# Patient Record
Sex: Female | Born: 1995 | Hispanic: No | Marital: Married | Smoking: Never smoker
Health system: Southern US, Community
[De-identification: ages and names within clinical notes are randomized; demographics above are authoritative.]

## PROBLEM LIST (undated history)

## (undated) ENCOUNTER — Inpatient Hospital Stay (HOSPITAL_COMMUNITY): Payer: Self-pay

## (undated) DIAGNOSIS — R000 Tachycardia, unspecified: Secondary | ICD-10-CM

## (undated) DIAGNOSIS — E538 Deficiency of other specified B group vitamins: Secondary | ICD-10-CM

## (undated) DIAGNOSIS — O039 Complete or unspecified spontaneous abortion without complication: Secondary | ICD-10-CM

## (undated) DIAGNOSIS — Z789 Other specified health status: Secondary | ICD-10-CM

## (undated) HISTORY — PX: NO PAST SURGERIES: SHX2092

---
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
## 2015-01-03 LAB — OB RESULTS CONSOLE HIV ANTIBODY (ROUTINE TESTING): HIV: NONREACTIVE

## 2015-01-03 LAB — OB RESULTS CONSOLE RPR: RPR: NONREACTIVE

## 2015-01-05 ENCOUNTER — Other Ambulatory Visit (HOSPITAL_COMMUNITY): Payer: Self-pay | Admitting: Urology

## 2015-01-05 DIAGNOSIS — Z3689 Encounter for other specified antenatal screening: Secondary | ICD-10-CM

## 2015-01-16 MED ORDER — AMOXICILLIN 500 MG PO CAPS: 500.0000 mg | ORAL_CAPSULE | Freq: Three times a day (TID) | ORAL | Status: DC

## 2015-01-16 NOTE — Telephone Encounter (Signed)
See telephone note Erin Rosenthal, NP

## 2015-01-26 ENCOUNTER — Ambulatory Visit (HOSPITAL_COMMUNITY)
Admission: RE | Admit: 2015-01-26 | Discharge: 2015-01-26 | Disposition: A | Discharge status: Home / Self Care | Payer: Self-pay | Source: Ambulatory Visit | Attending: Physician Assistant | Admitting: Physician Assistant

## 2015-01-26 DIAGNOSIS — Z36 Encounter for antenatal screening of mother: Secondary | ICD-10-CM | POA: Insufficient documentation

## 2015-01-26 DIAGNOSIS — Z3689 Encounter for other specified antenatal screening: Secondary | ICD-10-CM

## 2015-01-31 ENCOUNTER — Inpatient Hospital Stay (HOSPITAL_COMMUNITY)
Admission: AD | Admit: 2015-01-31 | Discharge: 2015-01-31 | Disposition: A | Discharge status: Home / Self Care | Payer: Self-pay | Source: Ambulatory Visit | Attending: Obstetrics & Gynecology | Admitting: Obstetrics & Gynecology

## 2015-01-31 ENCOUNTER — Encounter (HOSPITAL_COMMUNITY): Payer: Self-pay

## 2015-01-31 DIAGNOSIS — Z3A20 20 weeks gestation of pregnancy: Secondary | ICD-10-CM | POA: Insufficient documentation

## 2015-01-31 DIAGNOSIS — O26892 Other specified pregnancy related conditions, second trimester: Secondary | ICD-10-CM | POA: Insufficient documentation

## 2015-01-31 DIAGNOSIS — E538 Deficiency of other specified B group vitamins: Secondary | ICD-10-CM | POA: Insufficient documentation

## 2015-01-31 DIAGNOSIS — S3991XA Unspecified injury of abdomen, initial encounter: Secondary | ICD-10-CM

## 2015-01-31 DIAGNOSIS — W010XXA Fall on same level from slipping, tripping and stumbling without subsequent striking against object, initial encounter: Secondary | ICD-10-CM | POA: Insufficient documentation

## 2015-01-31 DIAGNOSIS — Z8249 Family history of ischemic heart disease and other diseases of the circulatory system: Secondary | ICD-10-CM | POA: Insufficient documentation

## 2015-01-31 DIAGNOSIS — R10819 Abdominal tenderness, unspecified site: Secondary | ICD-10-CM | POA: Insufficient documentation

## 2015-01-31 LAB — URINE MICROSCOPIC-ADD ON

## 2015-01-31 LAB — URINALYSIS, ROUTINE W REFLEX MICROSCOPIC
Bilirubin Urine: NEGATIVE
GLUCOSE, UA: NEGATIVE mg/dL
Hgb urine dipstick: NEGATIVE
Ketones, ur: NEGATIVE mg/dL
NITRITE: NEGATIVE
PROTEIN: NEGATIVE mg/dL
Specific Gravity, Urine: 1.01 (ref 1.005–1.030)
UROBILINOGEN UA: 0.2 mg/dL (ref 0.0–1.0)
pH: 6.5 (ref 5.0–8.0)

## 2015-01-31 NOTE — MAU Note (Signed)
Pt presents to MAU with complaints of falling on her abdomen while she was being chased by a dog. Denies any vaginal bleeding.

## 2015-01-31 NOTE — MAU Provider Note (Signed)
MAU HISTORY AND PHYSICAL  Chief Complaint:  Erin Newman is a 19 y.o.  G3P1011 with IUP at [redacted]w[redacted]d presenting for Fall Patient was walking carrying her young child when she saw a dog that scared her. She turned to run away, tripped, and fell, landing on her left hip and left abdomen. She currently has mild pain. NO vaginal bleeding or discharge. Has not yet felt baby move this pregnancy. No other injuries.   Menstrual History: OB History    Gravida Para Term Preterm AB TAB SAB Ectopic Multiple Living   0 1 0 1 0 0 1      Patient's last menstrual period was 09/13/2014 (exact date).      Past Medical History  Diagnosis Date  . B12 deficiency   . Rapid heart beat   . Miscarriage   . Medical history non-contributory     Past Surgical History  Procedure Laterality Date  . No past surgeries      Family History  Problem Relation Age of Onset  . Hypertension Mother   . Hypertension Father     Social History  Substance Use Topics  . Smoking status: Never Smoker   . Smokeless tobacco: Never Used  . Alcohol Use: No     No Known Allergies  Prescriptions prior to admission  Medication Sig Dispense Refill Last Dose  . nystatin cream (MYCOSTATIN) Apply 1 application topically 2 (two) times daily. 30 g 0 01/30/2015 at Unknown time  . Prenatal Vit-Fe Fumarate-FA (PRENATAL MULTIVITAMIN) TABS tablet Take 1 tablet by mouth daily at 12 noon.   Past Week at Unknown time  . amoxicillin (AMOXIL) 500 MG capsule Take 1 capsule (500 mg total) by mouth 3 (three) times daily. (Patient not taking: Reported on 01/31/2015) 30 capsule 0 Not Taking at Unknown time  . terconazole (TERAZOL 3) 0.8 % vaginal cream Place 1 applicator vaginally at bedtime. (Patient not taking: Reported on 01/31/2015) 20 g 0 Completed Course at Unknown time  . triamcinolone cream (KENALOG) 0.1 % Apply 1 application topically 2 (two) times daily. (Patient not taking: Reported on 01/31/2015) 30 g 0 Completed Course  at Unknown time    Review of Systems - Negative except for what is mentioned in HPI.  Physical Exam  Blood pressure 119/63, pulse 112, temperature 98.6 F (37 C), resp. rate 18, last menstrual period 09/13/2014, not currently breastfeeding. GENERAL: Well-developed, well-nourished female in no acute distress.  LUNGS: Clear to auscultation bilaterally.  HEART: Regular rate and rhythm. ABDOMEN: Soft, gravid to umbilicus, mild ttp left lower quadrant no rebound, no distention EXTREMITIES: Nontender, no edema, 2+ distal pulses.  FHTs: wnl  Labs: Results for orders placed or performed during the hospital encounter of 01/31/15 (from the past 24 hour(s))  Urinalysis, Routine w reflex microscopic (not at [facility])   Collection Time: 01/31/15 11:50 AM  Result Value Ref Range   Color, Urine YELLOW YELLOW   APPearance CLOUDY (A) CLEAR   Specific Gravity, Urine 1.010 1.005 - 1.030   pH 6.5 5.0 - 8.0   Glucose, UA NEGATIVE NEGATIVE mg/dL   Hgb urine dipstick NEGATIVE NEGATIVE   Bilirubin Urine NEGATIVE NEGATIVE   Ketones, ur NEGATIVE NEGATIVE mg/dL   Protein, ur NEGATIVE NEGATIVE mg/dL   Urobilinogen, UA 0.2 0.0 - 1.0 mg/dL   Nitrite NEGATIVE NEGATIVE   Leukocytes, UA SMALL (A) NEGATIVE  Urine microscopic-add on   Collection Time: 01/31/15 11:50 AM  Result Value Ref Range   Squamous  Epithelial / LPF MANY (A) RARE   WBC, UA 7-10 <3 WBC/hpf   RBC / HPF 0-2 <3 RBC/hpf   Bacteria, UA MANY (A) RARE    Imaging Studies:  [place] Mfm Of Comp + 14 Wk  01/26/2015   OBSTETRICAL ULTRASOUND: This exam was performed within a [facility]. The OB US report was generated in the AS system, and faxed to the ordering physician.   This report is available in the [employer]. See the [facility] Obstetric US report via the Image Link.   Assessment: Erin Newman is  20 y.o. G3P1011 at [redacted]w[redacted]d presents with fall from standing. Mild LLQ abdominal tenderness, no visible injury, otherwise normal exam.  Normal reassuring FHTs. Patient is O positive so no indication for rhogam. Monitored here for approximately 4 hours.  Plan: - d/c home with PTL, PPROM, and abruption precautions - health department f/u 3 days as planned  Silvano Bilis 8/20/20161:54 PM

## 2015-03-10 ENCOUNTER — Encounter (HOSPITAL_COMMUNITY): Payer: Self-pay | Admitting: *Deleted

## 2015-03-10 ENCOUNTER — Inpatient Hospital Stay (HOSPITAL_COMMUNITY)
Admission: AD | Admit: 2015-03-10 | Discharge: 2015-03-10 | Disposition: A | Discharge status: Home / Self Care | Payer: Self-pay | Source: Ambulatory Visit | Attending: Obstetrics and Gynecology | Admitting: Obstetrics and Gynecology

## 2015-03-10 DIAGNOSIS — Z3A25 25 weeks gestation of pregnancy: Secondary | ICD-10-CM | POA: Insufficient documentation

## 2015-03-10 DIAGNOSIS — R110 Nausea: Secondary | ICD-10-CM

## 2015-03-10 DIAGNOSIS — O99512 Diseases of the respiratory system complicating pregnancy, second trimester: Secondary | ICD-10-CM | POA: Insufficient documentation

## 2015-03-10 DIAGNOSIS — K30 Functional dyspepsia: Secondary | ICD-10-CM

## 2015-03-10 DIAGNOSIS — R6889 Other general symptoms and signs: Secondary | ICD-10-CM

## 2015-03-10 DIAGNOSIS — O212 Late vomiting of pregnancy: Secondary | ICD-10-CM | POA: Insufficient documentation

## 2015-03-10 DIAGNOSIS — J069 Acute upper respiratory infection, unspecified: Secondary | ICD-10-CM | POA: Insufficient documentation

## 2015-03-10 LAB — URINALYSIS, ROUTINE W REFLEX MICROSCOPIC
BILIRUBIN URINE: NEGATIVE
Glucose, UA: NEGATIVE mg/dL
Hgb urine dipstick: NEGATIVE
Ketones, ur: NEGATIVE mg/dL
NITRITE: NEGATIVE
Protein, ur: NEGATIVE mg/dL
SPECIFIC GRAVITY, URINE: 1.02 (ref 1.005–1.030)
UROBILINOGEN UA: 0.2 mg/dL (ref 0.0–1.0)
pH: 7.5 (ref 5.0–8.0)

## 2015-03-10 LAB — URINE MICROSCOPIC-ADD ON

## 2015-03-10 MED ORDER — RANITIDINE HCL 150 MG PO CAPS: 150.0000 mg | ORAL_CAPSULE | Freq: Every day | ORAL | Status: DC

## 2015-03-10 MED ORDER — ONDANSETRON HCL 4 MG PO TABS: 4.0000 mg | ORAL_TABLET | Freq: Once | ORAL | Status: DC

## 2015-03-10 MED ORDER — PROMETHAZINE HCL 25 MG PO TABS
25.0000 mg | ORAL_TABLET | Freq: Once | ORAL | Status: AC
  Administered 2015-03-10: 25 mg via ORAL
  Filled 2015-03-10: qty 1

## 2015-03-10 MED ORDER — PROMETHAZINE HCL 25 MG PO TABS: 12.5000 mg | ORAL_TABLET | Freq: Four times a day (QID) | ORAL | Status: DC | PRN

## 2015-03-10 NOTE — MAU Note (Addendum)
C/o cold for 2-3 days; c/o abdominal pain and nausea since this AM around 0600; describing some indigestion symptoms;

## 2015-03-10 NOTE — MAU Provider Note (Signed)
History     CSN: [id number]  Arrival date and time: 03/10/15 4098   First Dow Blahnik Initiated Contact with Patient 03/10/15 1300      Chief Complaint  Patient presents with  . URI  . Abdominal Pain  . Nausea   HPI Patient is a 19yo at 25+3 here for cold symptoms, nausea, and burning esophageal pain. Patient has had URI with nasal congestion, fever to 101 (none since Saturday), and sore throat. Today she awoke with nausea and burning in epigastric/esophageal region. She has not vomited but feels like she might so has not had anything to eat or drink today. Her husband was worrried about her and she wasn't sure what medicines she could take so came in to be evaluated. Did get a little dizzy today with walking. Endorses good FM, no LOF, no VB, and no contractions. She is very exhausted and hasn't been able to get much sleep.  OB History    Gravida Para Term Preterm AB TAB SAB Ectopic Multiple Living   0 1 0 1 0 0 1      Past Medical History  Diagnosis Date  . B12 deficiency   . Rapid heart beat   . Miscarriage   . Medical history non-contributory     Past Surgical History  Procedure Laterality Date  . No past surgeries      Family History  Problem Relation Age of Onset  . Hypertension Mother   . Hypertension Father     Social History  Substance Use Topics  . Smoking status: Never Smoker   . Smokeless tobacco: Never Used  . Alcohol Use: No    Allergies: No Known Allergies  Prescriptions prior to admission  Medication Sig Dispense Refill Last Dose  . Phenylephrine-DM-GG-APAP (TYLENOL COLD/FLU SEVERE PO) Take 2 capsules by mouth as needed (cold).   03/09/2015 at Unknown time  . Prenatal Vit-Fe Fumarate-FA (PRENATAL MULTIVITAMIN) TABS tablet Take 1 tablet by mouth daily at 12 noon.   03/09/2015 at Unknown time  . amoxicillin (AMOXIL) 500 MG capsule Take 1 capsule (500 mg total) by mouth 3 (three) times daily. (Patient not taking: Reported on 01/31/2015) 30 capsule 0  Not Taking at Unknown time  . nystatin cream (MYCOSTATIN) Apply 1 application topically 2 (two) times daily. (Patient not taking: Reported on 03/10/2015) 30 g 0 01/30/2015 at Unknown time  . terconazole (TERAZOL 3) 0.8 % vaginal cream Place 1 applicator vaginally at bedtime. (Patient not taking: Reported on 01/31/2015) 20 g 0 Completed Course at Unknown time  . triamcinolone cream (KENALOG) 0.1 % Apply 1 application topically 2 (two) times daily. (Patient not taking: Reported on 01/31/2015) 30 g 0 Completed Course at Unknown time    ROS: Negative except as listed in HPI Physical Exam   Blood pressure 102/55, pulse 100, temperature 98.1 F (36.7 C), temperature source Oral, resp. rate 18, last menstrual period 09/13/2014, not currently breastfeeding.  Physical Exam  Constitutional: She is oriented to person, place, and time. She appears well-developed and well-nourished.  HENT:  Head: Normocephalic and atraumatic.  Nose: Nose normal.  Mouth/Throat: Oropharynx is clear and moist.  Eyes: Conjunctivae are normal. Pupils are equal, round, and reactive to light.  Cardiovascular: Normal rate, regular rhythm, normal heart sounds and intact distal pulses.  Exam reveals no gallop and no friction rub.   No murmur heard. Respiratory: Effort normal. No respiratory distress. She has no wheezes. She has no rales.  GI: Soft. Bowel sounds are normal.  Neurological: She is alert and oriented to person, place, and time.  Skin: Skin is warm and dry.    MAU Course  Procedures  MDM EFM: 140, mod var, +accels, no decels; toco-no contractions  Assessment and Plan  G3P1011 at 25+3 here for URI symptoms and nausea/vomiting -Gave phenergan here for nausea, will send home with phenergan as well. Has ride home. Do not drive with this medicine -Take mucinex OTC for congestion. Gave list of safe meds in pregnancy that she can take OTC for cold sx -Increase PO fluid intake. Start with simple carb diet to see if  tolerating solids. -Return to MAU if any worsening abdominal pain, high fevers, decreased fetal movement, LOF, VB, or contractions.  Durenda Hurt 03/10/2015, 1:02 PM   CNM attestation:  I have seen and examined this patient; I agree with above documentation in the resident's note.   Antonette Maddy is a 19 y.o. G3P1011 reporting nasal congestion and heartburn. She reports that it is difficult to drink fluids because it makes her nauseous and causes heartburn. She also reports she has not slept well since she is congested.  Denies VB, cramping, urinary symptoms, vaginal itching/burning.  PE: BP 105/58 mmHg  Pulse 79  Temp(Src) 98.1 F (36.7 C) (Oral)  Resp 16  LMP 09/13/2014 (Exact Date) Gen: calm comfortable, NAD Resp: normal effort, no distress Abd: soft, nontender  ROS, labs, PMH reviewed   Plan: D/C home Teaching done about safe medications in pregnancy Rx for Phenergan and Zantac Follow up as scheduled in [facility] Return to MAU as needed for emergencies   LEFTWICH-KIRBY, LISA, CNM 7:56 PM

## 2015-03-10 NOTE — Discharge Instructions (Signed)
Upper Respiratory Infection, Adult °An upper respiratory infection (URI) is also sometimes known as the common cold. The upper respiratory tract includes the nose, sinuses, throat, trachea, and bronchi. Bronchi are the airways leading to the lungs. Most people improve within 1 week, but symptoms can last up to 2 weeks. A residual cough may last even longer.  °CAUSES °Many different viruses can infect the tissues lining the upper respiratory tract. The tissues become irritated and inflamed and often become very moist. Mucus production is also common. A cold is contagious. You can easily spread the virus to others by oral contact. This includes kissing, sharing a glass, coughing, or sneezing. Touching your mouth or nose and then touching a surface, which is then touched by another person, can also spread the virus. °SYMPTOMS  °Symptoms typically develop 1 to 3 days after you come in contact with a cold virus. Symptoms vary from person to person. They may include: °· Runny nose. °· Sneezing. °· Nasal congestion. °· Sinus irritation. °· Sore throat. °· Loss of voice (laryngitis). °· Cough. °· Fatigue. °· Muscle aches. °· Loss of appetite. °· Headache. °· Low-grade fever. °DIAGNOSIS  °You might diagnose your own cold based on familiar symptoms, since most people get a cold 2 to 3 times a year. Your caregiver can confirm this based on your exam. Most importantly, your caregiver can check that your symptoms are not due to another disease such as strep throat, sinusitis, pneumonia, asthma, or epiglottitis. Blood tests, throat tests, and X-rays are not necessary to diagnose a common cold, but they may sometimes be helpful in excluding other more serious diseases. Your caregiver will decide if any further tests are required. °RISKS AND COMPLICATIONS  °You may be at risk for a more severe case of the common cold if you smoke cigarettes, have chronic heart disease (such as heart failure) or lung disease (such as asthma), or if  you have a weakened immune system. The very young and very old are also at risk for more serious infections. Bacterial sinusitis, middle ear infections, and bacterial pneumonia can complicate the common cold. The common cold can worsen asthma and chronic obstructive pulmonary disease (COPD). Sometimes, these complications can require emergency medical care and may be life-threatening. °PREVENTION  °The best way to protect against getting a cold is to practice good hygiene. Avoid oral or hand contact with people with cold symptoms. Wash your hands often if contact occurs. There is no clear evidence that vitamin C, vitamin E, echinacea, or exercise reduces the chance of developing a cold. However, it is always recommended to get plenty of rest and practice good nutrition. °TREATMENT  °Treatment is directed at relieving symptoms. There is no cure. Antibiotics are not effective, because the infection is caused by a virus, not by bacteria. Treatment may include: °· Increased fluid intake. Sports drinks offer valuable electrolytes, sugars, and fluids. °· Breathing heated mist or steam (vaporizer or shower). °· Eating chicken soup or other clear broths, and maintaining good nutrition. °· Getting plenty of rest. °· Using gargles or lozenges for comfort. °· Controlling fevers with ibuprofen or acetaminophen as directed by your caregiver. °· Increasing usage of your inhaler if you have asthma. °Zinc gel and zinc lozenges, taken in the first 24 hours of the common cold, can shorten the duration and lessen the severity of symptoms. Pain medicines may help with fever, muscle aches, and throat pain. A variety of non-prescription medicines are available to treat congestion and runny nose. Your caregiver   can make recommendations and may suggest nasal or lung inhalers for other symptoms.  HOME CARE INSTRUCTIONS   Only take over-the-counter or prescription medicines for pain, discomfort, or fever as directed by your  caregiver.  Use a warm mist humidifier or inhale steam from a shower to increase air moisture. This may keep secretions moist and make it easier to breathe.  Drink enough water and fluids to keep your urine clear or pale yellow.  Rest as needed.  Return to work when your temperature has returned to normal or as your caregiver advises. You may need to stay home longer to avoid infecting others. You can also use a face mask and careful hand washing to prevent spread of the virus. SEEK MEDICAL CARE IF:   After the first few days, you feel you are getting worse rather than better.  You need your caregiver's advice about medicines to control symptoms.  You develop chills, worsening shortness of breath, or brown or red sputum. These may be signs of pneumonia.  You develop yellow or brown nasal discharge or pain in the face, especially when you bend forward. These may be signs of sinusitis.  You develop a fever, swollen neck glands, pain with swallowing, or white areas in the back of your throat. These may be signs of strep throat. SEEK IMMEDIATE MEDICAL CARE IF:   You have a fever.  You develop severe or persistent headache, ear pain, sinus pain, or chest pain.  You develop wheezing, a prolonged cough, cough up blood, or have a change in your usual mucus (if you have chronic lung disease).  You develop sore muscles or a stiff neck. Document Released: 11/23/2000 Document Revised: 08/22/2011 Document Reviewed: 09/04/2013 [facility] Patient Information 2015 [place], [place]. This information is not intended to replace advice given to you by your health care provider. Make sure you discuss any questions you have with your health care provider. Heartburn During Pregnancy  Heartburn is a burning sensation in the chest caused by stomach acid backing up into the esophagus. Heartburn is common in pregnancy because a certain hormone (progesterone) is released when a woman is pregnant. The progesterone  hormone may relax the valve that separates the esophagus from the stomach. This allows acid to go up into the esophagus, causing heartburn. Heartburn may also happen in pregnancy because the enlarging uterus pushes up on the stomach, which pushes more acid into the esophagus. This is especially true in the later stages of pregnancy. Heartburn problems usually go away after giving birth. CAUSES  Heartburn is caused by stomach acid backing up into the esophagus. During pregnancy, this may result from various things, including:   The progesterone hormone.  Changing hormone levels.  The growing uterus pushing stomach acid upward.  Large meals.  Certain foods and drinks.  Exercise.  Increased acid production. SIGNS AND SYMPTOMS   Burning pain in the chest or lower throat.  Bitter taste in the mouth.  Coughing. DIAGNOSIS  Your health care provider will typically diagnose heartburn by taking a careful history of your concern. Blood tests may be done to check for a certain type of bacteria that is associated with heartburn. Sometimes, heartburn is diagnosed by prescribing a heartburn medicine to see if the symptoms improve. In some cases, a procedure called an endoscopy may be done. In this procedure, a tube with a light and a camera on the end (endoscope) is used to examine the esophagus and the stomach. TREATMENT  Treatment will vary depending on the  severity of your symptoms. Your health care provider may recommend:  Over-the-counter medicines (antacids, acid reducers) for mild heartburn.  Prescription medicines to decrease stomach acid or to protect your stomach lining.  Certain changes in your diet.  Elevating the head of your bed by putting blocks under the legs. This helps prevent stomach acid from backing up into the esophagus when you are lying down. HOME CARE INSTRUCTIONS   Only take over-the-counter or prescription medicines as directed by your health care provider.  Raise  the head of your bed by putting blocks under the legs if instructed to do so by your health care provider. Sleeping with more pillows is not effective because it only changes the position of your head.  Do not exercise right after eating.  Avoid eating 2-3 hours before bed. Do not lie down right after eating.  Eat small meals throughout the day instead of three large meals.  Identify foods and beverages that make your symptoms worse and avoid them. Foods you may want to avoid include:  Peppers.  Chocolate.  High-fat foods, including fried foods.  Spicy foods.  Garlic and onions.  Citrus fruits, including oranges, grapefruit, lemons, and limes.  Food containing tomatoes or tomato products.  Mint.  Carbonated and caffeinated drinks.  Vinegar. SEEK MEDICAL CARE IF:  You have abdominal pain of any kind.  You feel burning in your upper abdomen or chest, especially after eating or lying down.  You have nausea and vomiting.  Your stomach feels upset after you eat. SEEK IMMEDIATE MEDICAL CARE IF:   You have severe chest pain that goes down your arm or into your jaw or neck.  You feel sweaty, dizzy, or light-headed.  You become short of breath.  You vomit blood.  You have difficulty or pain with swallowing.  You have bloody or black, tarry stools.  You have episodes of heartburn more than 3 times a week, for more than 2 weeks. MAKE SURE YOU:  Understand these instructions.  Will watch your condition.  Will get help right away if you are not doing well or get worse. Document Released: 05/27/2000 Document Revised: 06/04/2013 Document Reviewed: 01/16/2013 [facility] Patient Information 2015 [place], [place]. This information is not intended to replace advice given to you by your health care provider. Make sure you discuss any questions you have with your health care provider.

## 2015-04-24 ENCOUNTER — Encounter (HOSPITAL_COMMUNITY): Payer: Self-pay | Admitting: Emergency Medicine

## 2015-04-24 ENCOUNTER — Emergency Department (HOSPITAL_COMMUNITY)
Admission: EM | Admit: 2015-04-24 | Discharge: 2015-04-24 | Disposition: A | Discharge status: Home / Self Care | Payer: Self-pay | Attending: Emergency Medicine | Admitting: Emergency Medicine

## 2015-04-24 DIAGNOSIS — K029 Dental caries, unspecified: Secondary | ICD-10-CM | POA: Insufficient documentation

## 2015-04-24 DIAGNOSIS — K0889 Other specified disorders of teeth and supporting structures: Secondary | ICD-10-CM | POA: Insufficient documentation

## 2015-04-24 DIAGNOSIS — Z79899 Other long term (current) drug therapy: Secondary | ICD-10-CM | POA: Insufficient documentation

## 2015-04-24 MED ORDER — PENICILLIN V POTASSIUM 250 MG PO TABS
500.0000 mg | ORAL_TABLET | Freq: Once | ORAL | Status: AC
  Administered 2015-04-24: 500 mg via ORAL
  Filled 2015-04-24: qty 2

## 2015-04-24 MED ORDER — BUPIVACAINE-EPINEPHRINE (PF) 0.5% -1:200000 IJ SOLN
1.8000 mL | Freq: Once | INTRAMUSCULAR | Status: AC
  Administered 2015-04-24: 1.8 mL
  Filled 2015-04-24: qty 1.8

## 2015-04-24 MED ORDER — PENICILLIN V POTASSIUM 500 MG PO TABS: 500.0000 mg | ORAL_TABLET | Freq: Three times a day (TID) | ORAL | Status: DC

## 2015-04-24 MED ORDER — ACETAMINOPHEN 500 MG PO TABS: 500.0000 mg | ORAL_TABLET | Freq: Four times a day (QID) | ORAL | Status: DC | PRN

## 2015-04-24 NOTE — Discharge Instructions (Signed)
Please follow up closely with a dentist for further management of your dental pain.  Dental Pain Dental pain may be caused by many things, including:  Tooth decay (cavities or caries). Cavities expose the nerve of your tooth to air and hot or cold temperatures. This can cause pain or discomfort.  Abscess or infection. A dental abscess is a collection of infected pus from a bacterial infection in the inner part of the tooth (pulp). It usually occurs at the end of the tooth's root.  Injury.  An unknown reason (idiopathic). Your pain may be mild or severe. It may only occur when:  You are chewing.  You are exposed to hot or cold temperature.  You are eating or drinking sugary foods or beverages, such as soda or candy. Your pain may also be constant. HOME CARE INSTRUCTIONS Watch your dental pain for any changes. The following actions may help to lessen any discomfort that you are feeling:  Take medicines only as directed by your dentist.  If you were prescribed an antibiotic medicine, finish all of it even if you start to feel better.  Keep all follow-up visits as directed by your dentist. This is important.  Do not apply heat to the outside of your face.  Rinse your mouth or gargle with salt water if directed by your dentist. This helps with pain and swelling.  You can make salt water by adding  tsp of salt to 1 cup of warm water.  Apply ice to the painful area of your face:  Put ice in a plastic bag.  Place a towel between your skin and the bag.  Leave the ice on for 20 minutes, 2-3 times per day.  Avoid foods or drinks that cause you pain, such as:  Very hot or very cold foods or drinks.  Sweet or sugary foods or drinks. SEEK MEDICAL CARE IF:  Your pain is not controlled with medicines.  Your symptoms are worse.  You have new symptoms. SEEK IMMEDIATE MEDICAL CARE IF:  You are unable to open your mouth.  You are having trouble breathing or swallowing.  You  have a fever.  Your face, neck, or jaw is swollen.   This information is not intended to replace advice given to you by your health care provider. Make sure you discuss any questions you have with your health care provider.   Document Released: 05/30/2005 Document Revised: 10/14/2014 Document Reviewed: 05/26/2014 Elsevier Interactive Patient Education 2016 [employer]   Emergency Department Resource Guide 1) Find a Doctor and Pay Out of Pocket Although you won't have to find out who is covered by your insurance plan, it is a good idea to ask around and get recommendations. You will then need to call the office and see if the doctor you have chosen will accept you as a new patient and what types of options they offer for patients who are self-pay. Some doctors offer discounts or will set up payment plans for their patients who do not have insurance, but you will need to ask so you aren't surprised when you get to your appointment.  2) Contact Your Local Health Department Not all health departments have doctors that can see patients for sick visits, but many do, so it is worth a call to see if yours does. If you don't know where your local health department is, you can check in your phone book. The CDC also has a tool to help you locate your state's health department, and  many state websites also have listings of all of their local health departments.  3) Find a Walk-in Clinic If your illness is not likely to be very severe or complicated, you may want to try a walk in clinic. These are popping up all over the country in pharmacies, drugstores, and shopping centers. They're usually staffed by nurse practitioners or physician assistants that have been trained to treat common illnesses and complaints. They're usually fairly quick and inexpensive. However, if you have serious medical issues or chronic medical problems, these are probably not your best option.  No Primary Care Doctor: - Call Health  Connect at  [phone] - they can help you locate a primary care doctor that  accepts your insurance, provides certain services, etc. - Physician Referral Service- [phone]  Chronic Pain Problems: Organization         Address  Phone   Notes  [facility]  [phone] Patients need to be referred by their primary care doctor.   Medication Assistance: Organization         Address  Phone   Notes  [facility] Medication [facility] [address] [place] [place], [place] [address] [phone] --Must be a resident of [facility] -- Must have NO insurance coverage whatsoever (no Medicaid/ Medicare, etc.) -- The pt. MUST have a primary care doctor that directs their care regularly and follows them in the community   Med[facility]ist  [phone]   [employer]  [phone]    Agencies that provide inexpensive medical care: Organization         Address  Phone   Notes  [facility]  [phone]   [facility]    [phone]   [facility] [address] [place], [place] [address] [phone]   [facility] [address] [place], [place] [phone]   Planned Parenthood    [phone]   [facility]    [phone]   [facility] and [facility]  [address], [place] Phone:  [phone], Fax:  [phone] Hours of Operation:  9 am - 6 pm, M-F.  Also accepts Medicaid/Medicare and self-pay.  [facility]  [address], [place] Phone: [phone], Fax: [phone]. Hours of Operation:  8:30 am - 5:30 pm, M-F.  Also accepts Medicaid and self-pay.  [facility] [place] [facility] [address] [place] Phone: [phone]   [facility] [address] Natasha Bence [place], [place] [phone], Ext. [phone] Mondays & Thursdays: 7-9 AM.  First 15 patients are seen on a first come, first serve basis.     Medicaid-accepting [facility] Providers:  Organization         Address  Phone   Notes  [facility] [address], [place] [phone] Also accepts self-pay patients.  [facility] [address] Laurell Josephs [employer], [place]  [phone]   [facility] [address], [place] [phone]   [facility] [address], [place] [phone]   Renaye Rakers [address], [place]   [phone] Only accepts [place] Access [place] patients after they have their name applied to their card.   Self-Pay (no insurance) in [facility]:  Organization  Address  Phone   Notes  Sickle Cell Patients, [facility] [place] [phone]   [facility] [place] [phone]   [facility] [place]  [place], [place] [phone]   [facility]/Dr. Osei-Bonsu  [address] [place] or [address], [place] [phone] Phone number for both [place] and [place] locations is the same.  [facility] [address], [place] [phone]   [facility] [address], [place] or [address] [phone]   [facility] [address] [place] [phone], phone; [phone], fax Sees patients 1st and 3rd Saturday of every month.  Must not qualify for public or private insurance (i.e. Medicaid, Medicare, [place] Health Choice, Veterans' Benefits)  Household income should be no more than 200% of the poverty level The clinic cannot treat you if you are pregnant or think you are pregnant  Sexually transmitted diseases are not treated at the clinic.    Dental Care: Organization         Address  Phone  Notes  [facility]  Department of [facility] [phone] Accepts children up to age 62 who are enrolled in [place] or [place]; pregnant women with a Medicaid card; and children who have applied for Medicaid or [place] Health Choice, but were declined, whose parents can pay a reduced fee at time of service.  [facility] Department of [facility]  [address], [place] [phone] Accepts children up to age 70 who are enrolled in [place] or [place]; pregnant women with a Medicaid card; and children who have applied for Medicaid or [place] Health Choice, but were declined, whose parents can pay a reduced fee at time of service.  [place] [facility]  [place] [phone] Patients are seen by appointment only. Walk-ins are not accepted. [place] will see patients 15 years of age and older. Monday - Tuesday (8am-5pm) Most Wednesdays (8:30-5pm) $30 per visit, cash only  [facility] Adult Dental Access PROGRAM  [address], [facility] [phone] Patients are seen by appointment only. Walk-ins are not accepted. [place] will see patients 45 years of age and older. One Wednesday Evening (Monthly: Volunteer Based).  $30 per visit, cash only  [place]  [phone] for adults; Children under age 51, call Graduate Pediatric Dentistry at [phone]. Children aged 69-14, please call [phone] to request a pediatric application.  Dental services are provided in all areas of dental care including fillings, crowns and bridges, complete and partial dentures, implants, gum treatment, root canals, and extractions. Preventive care is also provided. Treatment is provided to both adults and children. Patients are selected via a lottery and there is often a waiting list.   [facility] [address], [place]  [phone]  [email or web address]   [facility] [address] [place], [place] [phone], Ext. [phone] Second and Fourth Thursday of each month, opens at 6:30 AM; Clinic ends at 9 AM.  Patients are seen on a first-come first-served basis, and a limited number are seen during each clinic.   [facility]  [address] [place], [place]  [place], [place] [phone]   Eligibility Requirements You must have lived in [place], [place], or [place] counties for at least the last three months.   You cannot be eligible for state or federal sponsored [employer], including [employer], [place], or [employer].   You generally cannot be eligible for healthcare insurance through your employer.    How to apply: Eligibility screenings are held every Tuesday and Wednesday afternoon from 1:00 pm until 4:00 pm. You do not need an appointment for the interview!  [facility] [address] [place], [place]   [place]  [place] Department  [place]  [phone]    Behavioral Health Resources in the Community: Intensive Outpatient Programs Organization         Address  Phone  Notes  Ione Langeloth. [address] [place], [place] [phone]   [facility] Outpatient [address] [place], [place]   ADS: [facility] [address] [place], [place]   [place] [address],  [place], [place] or [phone]   Substance Abuse Resources Organization         Address  Phone  Notes  Alcohol and Drug Services  [phone]   [place]  [phone]   [facility] [place]   Chinita Pester  [phone]   Residential & Outpatient Substance Abuse Program  [phone]   Psychological Services Organization          Address  Phone  Notes  [facility]  [place]  [phone]   [place] [address], [place] or [phone]    Mobile Crisis Teams Organization         Address  Phone  Notes  [facility], Mobile Crisis Care Unit  [phone]   [facility]  [address] [place], [place]   [place] Levels [address], [place] [phone]    Self-Help/Support Groups Organization         Address  Phone             Notes  [place]. of [place] - variety of support groups  [place] Call for more information  Narcotics Anonymous (NA), [facility] [address], [employer] [place]  2 meetings at this location   Special educational needs teacher         Address  Phone  Notes  [facility] [place],    Susan Moore  [phone]   [facility]  [address] [place] [phone], [place], [place]   [place], [place] [phone] Admissions: 8am-3pm M-F  Incentives Substance [place] [address]    [place], [place] [phone]   [facility] [address] Jadene Pierini [place], [place]   [facility] [address]  [place], [place]   [facility] - Intensive Outpatient [address] Kristeen [address], [place], [place]   [facility] [place].,  [place], [place] or [phone]   [facility] ([facility]) [address], [place], [place] Accepts Medicaid  [facility] [place] [address],  [place] [phone] Substance Abuse/Addiction Treatment   [facility] Resources Organization  Address  Phone  Notes  [facility]  [phone]   Domenic Schwab, PhD [address] Arlis Porta [place], [place]   [phone] or [phone]   [place], [place] [phone]   [address], [place], [place] [phone] Insurance/Medicaid/sponsorship through [facility] and Families [address] [place]                                    [place], [place] [phone] [place] [address], [place] [phone]    Dr. Adele Schilder  [phone]   [facility] Dept. 1) [address] [place] 2) [place] 3)  [address], [place] [phone]  [phone]   [place] [phone] or [phone] (After Hours)

## 2015-04-24 NOTE — ED Provider Notes (Signed)
CSN: [id number]     Arrival date & time 04/24/15  1933 History  By signing my name below, I, Erin Newman, attest that this documentation has been prepared under the direction and in the presence of Fayrene Helper, PA-C. Electronically Signed: Elon Newman ED Scribe. 04/24/2015. 8:13 PM.    Chief Complaint  Patient presents with  . Dental Pain   The history is provided by the patient. No language interpreter was used.   HPI Comments: Erin Newman is a 19 y.o. [redacted] week pregnant female who presents to the Emergency Department complaining of constant, moderate left upper dental pain onset two hours ago; unrelieved by Tylenol.  She reports a prior hx of similar pain from the same tooth 4 years ago that was treated with an amalgam filling.  She denies fever.  NKA.     Past Medical History  Diagnosis Date  . B12 deficiency   . Rapid heart beat   . Miscarriage   . Medical history non-contributory    Past Surgical History  Procedure Laterality Date  . No past surgeries     Family History  Problem Relation Age of Onset  . Hypertension Mother   . Hypertension Father    Social History  Substance Use Topics  . Smoking status: Never Smoker   . Smokeless tobacco: Never Used  . Alcohol Use: No   OB History    Gravida Para Term Preterm AB TAB SAB Ectopic Multiple Living   0 1 0 1 0 0 1     Review of Systems  HENT: Positive for dental problem.       Allergies  Review of patient's allergies indicates no known allergies.  Home Medications   Prior to Admission medications   Medication Sig Start Date End Date Taking? Authorizing Provider  Phenylephrine-DM-GG-APAP (TYLENOL COLD/FLU SEVERE PO) Take 2 capsules by mouth as needed (cold).    Historical Provider, MD  Prenatal Vit-Fe Fumarate-FA (PRENATAL MULTIVITAMIN) TABS tablet Take 1 tablet by mouth daily at 12 noon.    Historical Provider, MD  promethazine (PHENERGAN) 25 MG tablet Take 0.5-1 tablets (12.5-25 mg total) by mouth  every 6 (six) hours as needed for nausea or vomiting. 03/10/15   Wilmer Floor Leftwich-Kirby, CNM  ranitidine (ZANTAC) 150 MG capsule Take 1 capsule (150 mg total) by mouth daily. 03/10/15   Misty Stanley A Leftwich-Kirby, CNM   BP 112/52 mmHg  Pulse 73  Temp(Src) 97.1 F (36.2 C) (Oral)  Resp 16  SpO2 98%  LMP 09/13/2014 (Exact Date) Physical Exam  Constitutional: She is oriented to person, place, and time. She appears well-developed and well-nourished. No distress.  HENT:  Head: Normocephalic and atraumatic.  Tenderness noted to tooth #14 with small dental decay noted.  Amalgam filling involving most of the tooth.  There is no abscess amenable for drainage.  No gingival erythema.    Eyes: Conjunctivae and EOM are normal.  Neck: Neck supple. No tracheal deviation present.  Cardiovascular: Normal rate.   Pulmonary/Chest: Effort normal. No respiratory distress.  Musculoskeletal: Normal range of motion.  Lymphadenopathy:    She has no cervical adenopathy.  Neurological: She is alert and oriented to person, place, and time.  Skin: Skin is warm and dry.  Psychiatric: She has a normal mood and affect. Her behavior is normal.  Nursing note and vitals reviewed.   ED Course  Dental Date/Time: 04/24/2015 8:36 PM Performed by: Fayrene Helper Authorized by: Fayrene Helper Consent: Verbal consent obtained. Risks and benefits:  risks, benefits and alternatives were discussed Consent given by: patient Patient understanding: patient states understanding of the procedure being performed Patient consent: the patient's understanding of the procedure matches consent given Patient identity confirmed: verbally with patient and arm band Time out: Immediately prior to procedure a "time out" was called to verify the correct patient, procedure, equipment, support staff and site/side marked as required. Preparation: Patient was prepped and draped in the usual sterile fashion. Local anesthesia used: yes Anesthesia: nerve  block Local anesthetic: bupivacaine 0.5% with epinephrine Anesthetic total: 1.8 ml Patient sedated: no Patient tolerance: Patient tolerated the procedure well with no immediate complications Comments: periostial nerve block overlying tooth #14   (including critical care time)  DIAGNOSTIC STUDIES: Oxygen Saturation is 98% on RA, normal by my interpretation.    COORDINATION OF CARE:  8:10 PM Will prescribe antibiotic.  Patient offered  dental block and agrees.  Patient may continue to use Tylenol for pain and f/u with dentist.  Patient acknowledges and agrees with plan.      Labs Review Labs Reviewed - No data to display  Imaging Review No results found. I have personally reviewed and evaluated these images and lab results as part of my medical decision-making.   EKG Interpretation None      MDM   Final diagnoses:  Pain due to dental caries    BP 112/52 mmHg  Pulse 73  Temp(Src) 97.1 F (36.2 C) (Oral)  Resp 16  SpO2 98%  LMP 09/13/2014 (Exact Date)   I personally performed the services described in this documentation, which was scribed in my presence. The recorded information has been reviewed and is accurate.      Fayrene HelperBowie My Rinke, PA-C 04/24/15 2037  Lavera Guiseana Duo Liu, MD 04/25/15 [phone]

## 2015-04-24 NOTE — ED Notes (Signed)
C/o L upper toothache x 3 hours.  Pt is pregnant.

## 2015-05-06 ENCOUNTER — Inpatient Hospital Stay (HOSPITAL_COMMUNITY)
Admission: AD | Admit: 2015-05-06 | Discharge: 2015-05-06 | Disposition: A | Discharge status: Home / Self Care | Payer: Self-pay | Source: Ambulatory Visit | Attending: Obstetrics & Gynecology | Admitting: Obstetrics & Gynecology

## 2015-05-06 ENCOUNTER — Encounter (HOSPITAL_COMMUNITY): Payer: Self-pay | Admitting: *Deleted

## 2015-05-06 DIAGNOSIS — O9989 Other specified diseases and conditions complicating pregnancy, childbirth and the puerperium: Secondary | ICD-10-CM

## 2015-05-06 DIAGNOSIS — O26853 Spotting complicating pregnancy, third trimester: Secondary | ICD-10-CM | POA: Insufficient documentation

## 2015-05-06 DIAGNOSIS — N930 Postcoital and contact bleeding: Secondary | ICD-10-CM

## 2015-05-06 DIAGNOSIS — Z3A33 33 weeks gestation of pregnancy: Secondary | ICD-10-CM | POA: Insufficient documentation

## 2015-05-06 LAB — WET PREP, GENITAL
Clue Cells Wet Prep HPF POC: NONE SEEN
TRICH WET PREP: NONE SEEN
Yeast Wet Prep HPF POC: NONE SEEN

## 2015-05-06 LAB — OB RESULTS CONSOLE GC/CHLAMYDIA
Chlamydia: NEGATIVE
GC PROBE AMP, GENITAL: NEGATIVE

## 2015-05-06 NOTE — Discharge Instructions (Signed)
Vaginal Bleeding During Pregnancy, Third Trimester °A small amount of bleeding (spotting) from the vagina is relatively common in pregnancy. Various things can cause bleeding or spotting in pregnancy. Sometimes the bleeding is normal and is not a problem. However, bleeding during the third trimester can also be a sign of something serious for the mother and the baby. Be sure to tell your health care provider about any vaginal bleeding right away.  °Some possible causes of vaginal bleeding during the third trimester include:  °· The placenta may be partially or completely covering the opening to the cervix (placenta previa).   °· The placenta may have separated from the uterus (abruption of the placenta).   °· There may be an infection or growth on the cervix.   °· You may be starting labor, called discharging of the mucus plug.   °· The placenta may grow into the muscle layer of the uterus (placenta accreta).   °HOME CARE INSTRUCTIONS  °Watch your condition for any changes. The following actions may help to lessen any discomfort you are feeling:  °· Follow your health care provider's instructions for limiting your activity. If your health care provider orders bed rest, you may need to stay in bed and only get up to use the bathroom. However, your health care provider may allow you to continue light activity. °· If needed, make plans for someone to help with your regular activities and responsibilities while you are on bed rest. °· Keep track of the number of pads you use each day, how often you change pads, and how soaked (saturated) they are. Write this down. °· Do not use tampons. Do not douche. °· Do not have sexual intercourse or orgasms until approved by your health care provider. °· Follow your health care provider's advice about lifting, driving, and physical activities. °· If you pass any tissue from your vagina, save the tissue so you can show it to your health care provider.   °· Only take over-the-counter  or prescription medicines as directed by your health care provider. °· Do not take aspirin because it can make you bleed.   °· Keep all follow-up appointments as directed by your health care provider. °SEEK MEDICAL CARE IF: °· You have any vaginal bleeding during any part of your pregnancy. °· You have cramps or labor pains. °· You have a fever, not controlled by medicine. °SEEK IMMEDIATE MEDICAL CARE IF:  °· You have severe cramps or pain in your back or belly (abdomen). °· You have chills. °· You have a gush of fluid from the vagina. °· You pass large clots or tissue from your vagina. °· Your bleeding increases. °· You feel light-headed or weak. °· You pass out. °· You feel less movement or no movement of the baby.   °MAKE SURE YOU: °· Understand these instructions. °· Will watch your condition. °· Will get help right away if you are not doing well or get worse. °  °This information is not intended to replace advice given to you by your health care provider. Make sure you discuss any questions you have with your health care provider. °  °Document Released: 08/20/2002 Document Revised: 06/04/2013 Document Reviewed: 02/04/2013 °Elsevier Interactive Patient Education ©2016 [employer] ° °

## 2015-05-06 NOTE — MAU Note (Signed)
Pt reports vaginal bleeding x one hour, decreased fetal movement. Was having pain prior to the bleeding but no pain now.

## 2015-05-06 NOTE — MAU Provider Note (Signed)
History     CSN: [id number]  Arrival date and time: 05/06/15 2440   First Provider Initiated Contact with Patient 05/06/15 [phone]      No chief complaint on file.  HPI Erin Newman is a 19yo G3P1011 at [redacted]w[redacted]d who presents to the MAU tonight with thick vaginal bleeding starting at 1am this morning after having sex. She reports that she has had tight abdominal pain and headaches for two days. She denied any swelling in her hands and feet. The only thing that improved the pain in her abdomen was to walk around. The pain did not fully improve until her bleeding this evening. She felt that the baby had decreased movement this evening as well. She denies any additional LOF consistent with her water breaking. She reports that she has had white vaginal discharge and has been treating it with OTC diflucan. Blood type O+.  OB History    Gravida Para Term Preterm AB TAB SAB Ectopic Multiple Living   0 1 0 1 0 0 1      Past Medical History  Diagnosis Date  . B12 deficiency   . Rapid heart beat   . Miscarriage   . Medical history non-contributory     Past Surgical History  Procedure Laterality Date  . No past surgeries      Family History  Problem Relation Age of Onset  . Hypertension Mother   . Hypertension Father     Social History  Substance Use Topics  . Smoking status: Never Smoker   . Smokeless tobacco: Never Used  . Alcohol Use: No    Allergies: No Known Allergies  Prescriptions prior to admission  Medication Sig Dispense Refill Last Dose  . Prenatal Vit-Fe Fumarate-FA (PRENATAL MULTIVITAMIN) TABS tablet Take 1 tablet by mouth daily at 12 noon.   05/05/2015 at Unknown time  . acetaminophen (TYLENOL) 500 MG tablet Take 1 tablet (500 mg total) by mouth every 6 (six) hours as needed. 30 tablet 0   . penicillin v potassium (VEETID) 500 MG tablet Take 1 tablet (500 mg total) by mouth 3 (three) times daily. 30 tablet 0   . Phenylephrine-DM-GG-APAP (TYLENOL COLD/FLU  SEVERE PO) Take 2 capsules by mouth as needed (cold).   03/09/2015 at Unknown time  . promethazine (PHENERGAN) 25 MG tablet Take 0.5-1 tablets (12.5-25 mg total) by mouth every 6 (six) hours as needed for nausea or vomiting. 30 tablet 2   . ranitidine (ZANTAC) 150 MG capsule Take 1 capsule (150 mg total) by mouth daily. 30 capsule 0     ROS Physical Exam   Blood pressure 112/56, pulse 85, temperature 98.1 F (36.7 C), temperature source Oral, resp. rate 16, last menstrual period 09/13/2014, SpO2 100 %, not currently breastfeeding.  Physical Exam  Constitutional: She is oriented to person, place, and time. She appears well-developed.  HENT:  Head: Normocephalic.  Neck: Normal range of motion.  Cardiovascular: Normal rate.   Respiratory: Effort normal.  GI:  EFM 120s, +accels, no decels No ctx  Genitourinary:  SE: sm dk red bld at os, no active bleeding, cx C/L  Musculoskeletal: Normal range of motion.  Neurological: She is alert and oriented to person, place, and time.  Skin: Skin is warm and dry.  Psychiatric: She has a normal mood and affect. Her behavior is normal. Thought content normal.   Wet prep: few WBC, sperm present  MAU Course  Procedures  MDM Spec exam NST read WP/GC ordered  Assessment  and Plan  IUP@33 .4wks Postcoital spotting  D/C home Rec no intercourse x 7d F/U at next scheduled OB visit  Cam HaiSHAW, [place] Greb CNM 05/06/2015, 3:40 AM

## 2015-05-07 LAB — GC/CHLAMYDIA PROBE AMP (~~LOC~~) NOT AT ARMC
Chlamydia: NEGATIVE
Neisseria Gonorrhea: NEGATIVE

## 2015-06-14 NOTE — L&D Delivery Note (Signed)
Patient is 20 y.o. G3P1011 939w4d admitted for SOL.    Delivery Note At 11:30 AM a viable female was delivered via Vaginal, Spontaneous Delivery (Presentation: ; Occiput Anterior).  APGAR: 9, 9; weight: pending .   Placenta status: Intact, Spontaneous.  Cord: 3 vessels with the following complications: None.  Cord pH: not obtained  Anesthesia: None  Episiotomy: None Lacerations: None Suture Repair: N/A Est. Blood Loss (mL): 300  Mom to postpartum.  Baby to Couplet care / Skin to Skin.  Palma HolterKanishka G Gunadasa 06/24/2015, 11:48 AM      Upon arrival patient was complete and pushing. She pushed with good maternal effort to deliver a healthy baby boy. Baby delivered without difficulty, was noted to have good tone and place on maternal abdomen for oral suctioning, drying and stimulation. Delayed cord clamping performed. Placenta delivered intact with 3V cord. Vaginal canal and perineum was inspected and no lacerations were noted; hemostatic. Pitocin was started and uterus massaged until bleeding slowed. Counts of sharps, instruments, and lap pads were all correct.   Palma HolterKanishka G Gunadasa, MD PGY 1 Family Medicine

## 2015-06-24 ENCOUNTER — Encounter (HOSPITAL_COMMUNITY): Payer: Self-pay

## 2015-06-24 ENCOUNTER — Inpatient Hospital Stay (HOSPITAL_COMMUNITY)
Admission: AD | Admit: 2015-06-24 | Discharge: 2015-06-26 | DRG: 775 | Disposition: A | Discharge status: Home / Self Care | Payer: Medicaid Other | Source: Ambulatory Visit | Attending: Family Medicine | Admitting: Family Medicine

## 2015-06-24 DIAGNOSIS — O99824 Streptococcus B carrier state complicating childbirth: Secondary | ICD-10-CM | POA: Diagnosis present

## 2015-06-24 DIAGNOSIS — Z8249 Family history of ischemic heart disease and other diseases of the circulatory system: Secondary | ICD-10-CM

## 2015-06-24 DIAGNOSIS — Z3A40 40 weeks gestation of pregnancy: Secondary | ICD-10-CM

## 2015-06-24 DIAGNOSIS — O480 Post-term pregnancy: Principal | ICD-10-CM | POA: Diagnosis present

## 2015-06-24 DIAGNOSIS — IMO0001 Reserved for inherently not codable concepts without codable children: Secondary | ICD-10-CM

## 2015-06-24 LAB — TYPE AND SCREEN
ABO/RH(D): O POS
Antibody Screen: NEGATIVE

## 2015-06-24 LAB — CBC
HEMATOCRIT: 37.5 % (ref 36.0–46.0)
Hemoglobin: 12.9 g/dL (ref 12.0–15.0)
MCH: 30.7 pg (ref 26.0–34.0)
MCHC: 34.4 g/dL (ref 30.0–36.0)
MCV: 89.3 fL (ref 78.0–100.0)
Platelets: 146 10*3/uL — ABNORMAL LOW (ref 150–400)
RBC: 4.2 MIL/uL (ref 3.87–5.11)
RDW: 15 % (ref 11.5–15.5)
WBC: 10.8 10*3/uL — AB (ref 4.0–10.5)

## 2015-06-24 LAB — RPR: RPR: NONREACTIVE

## 2015-06-24 LAB — ABO/RH: ABO/RH(D): O POS

## 2015-06-24 MED ORDER — ONDANSETRON HCL 4 MG/2ML IJ SOLN: 4.0000 mg | Freq: Four times a day (QID) | INTRAMUSCULAR | Status: DC | PRN

## 2015-06-24 MED ORDER — DIPHENHYDRAMINE HCL 25 MG PO CAPS: 25.0000 mg | ORAL_CAPSULE | Freq: Four times a day (QID) | ORAL | Status: DC | PRN

## 2015-06-24 MED ORDER — FLEET ENEMA 7-19 GM/118ML RE ENEM: 1.0000 | ENEMA | RECTAL | Status: DC | PRN

## 2015-06-24 MED ORDER — BENZOCAINE-MENTHOL 20-0.5 % EX AERO
1.0000 "application " | INHALATION_SPRAY | CUTANEOUS | Status: DC | PRN
  Administered 2015-06-24: 1 via TOPICAL
  Filled 2015-06-24: qty 56

## 2015-06-24 MED ORDER — IBUPROFEN 600 MG PO TABS
600.0000 mg | ORAL_TABLET | Freq: Four times a day (QID) | ORAL | Status: DC | PRN
  Administered 2015-06-24: 600 mg via ORAL
  Filled 2015-06-24: qty 1

## 2015-06-24 MED ORDER — ACETAMINOPHEN 325 MG PO TABS
650.0000 mg | ORAL_TABLET | ORAL | Status: DC | PRN
  Administered 2015-06-26: 650 mg via ORAL
  Filled 2015-06-24: qty 2

## 2015-06-24 MED ORDER — FENTANYL CITRATE (PF) 100 MCG/2ML IJ SOLN
100.0000 ug | INTRAMUSCULAR | Status: DC | PRN
  Administered 2015-06-24: 100 ug via INTRAVENOUS
  Filled 2015-06-24: qty 2

## 2015-06-24 MED ORDER — OXYCODONE-ACETAMINOPHEN 5-325 MG PO TABS
1.0000 | ORAL_TABLET | ORAL | Status: DC | PRN
  Administered 2015-06-24: 1 via ORAL
  Filled 2015-06-24: qty 1

## 2015-06-24 MED ORDER — OXYTOCIN BOLUS FROM INFUSION
500.0000 mL | INTRAVENOUS | Status: DC
  Administered 2015-06-24: 500 mL via INTRAVENOUS

## 2015-06-24 MED ORDER — LACTATED RINGERS IV SOLN: 500.0000 mL | INTRAVENOUS | Status: DC | PRN

## 2015-06-24 MED ORDER — LACTATED RINGERS IV SOLN
1.0000 m[IU]/min | INTRAVENOUS | Status: DC
  Administered 2015-06-24: 2 m[IU]/min via INTRAVENOUS
  Filled 2015-06-24: qty 4

## 2015-06-24 MED ORDER — LANOLIN HYDROUS EX OINT: TOPICAL_OINTMENT | CUTANEOUS | Status: DC | PRN

## 2015-06-24 MED ORDER — SIMETHICONE 80 MG PO CHEW: 80.0000 mg | CHEWABLE_TABLET | ORAL | Status: DC | PRN

## 2015-06-24 MED ORDER — ONDANSETRON HCL 4 MG/2ML IJ SOLN: 4.0000 mg | INTRAMUSCULAR | Status: DC | PRN

## 2015-06-24 MED ORDER — PENICILLIN G POTASSIUM 5000000 UNITS IJ SOLR
5.0000 10*6.[IU] | Freq: Once | INTRAVENOUS | Status: AC
  Administered 2015-06-24: 5 10*6.[IU] via INTRAVENOUS
  Filled 2015-06-24: qty 5

## 2015-06-24 MED ORDER — ONDANSETRON HCL 4 MG PO TABS: 4.0000 mg | ORAL_TABLET | ORAL | Status: DC | PRN

## 2015-06-24 MED ORDER — LIDOCAINE HCL (PF) 1 % IJ SOLN
30.0000 mL | INTRAMUSCULAR | Status: DC | PRN
  Filled 2015-06-24: qty 30

## 2015-06-24 MED ORDER — IBUPROFEN 600 MG PO TABS
600.0000 mg | ORAL_TABLET | Freq: Four times a day (QID) | ORAL | Status: DC
  Administered 2015-06-24 – 2015-06-26 (×8): 600 mg via ORAL
  Filled 2015-06-24 (×8): qty 1

## 2015-06-24 MED ORDER — TETANUS-DIPHTH-ACELL PERTUSSIS 5-2.5-18.5 LF-MCG/0.5 IM SUSP: 0.5000 mL | Freq: Once | INTRAMUSCULAR | Status: DC

## 2015-06-24 MED ORDER — PENICILLIN G POTASSIUM 5000000 UNITS IJ SOLR
2.5000 10*6.[IU] | INTRAMUSCULAR | Status: DC
  Filled 2015-06-24 (×2): qty 2.5

## 2015-06-24 MED ORDER — TERBUTALINE SULFATE 1 MG/ML IJ SOLN
0.2500 mg | Freq: Once | INTRAMUSCULAR | Status: DC | PRN
  Filled 2015-06-24: qty 1

## 2015-06-24 MED ORDER — DIBUCAINE 1 % RE OINT: 1.0000 "application " | TOPICAL_OINTMENT | RECTAL | Status: DC | PRN

## 2015-06-24 MED ORDER — OXYCODONE-ACETAMINOPHEN 5-325 MG PO TABS
2.0000 | ORAL_TABLET | ORAL | Status: DC | PRN
Start: 2015-06-24 — End: 2015-06-24

## 2015-06-24 MED ORDER — CITRIC ACID-SODIUM CITRATE 334-500 MG/5ML PO SOLN: 30.0000 mL | ORAL | Status: DC | PRN

## 2015-06-24 MED ORDER — SENNOSIDES-DOCUSATE SODIUM 8.6-50 MG PO TABS
2.0000 | ORAL_TABLET | ORAL | Status: DC
  Administered 2015-06-24 – 2015-06-25 (×2): 2 via ORAL
  Filled 2015-06-24 (×2): qty 2

## 2015-06-24 MED ORDER — PRENATAL MULTIVITAMIN CH
1.0000 | ORAL_TABLET | Freq: Every day | ORAL | Status: DC
  Administered 2015-06-25: 1 via ORAL
  Filled 2015-06-24: qty 1

## 2015-06-24 MED ORDER — WITCH HAZEL-GLYCERIN EX PADS: 1.0000 "application " | MEDICATED_PAD | CUTANEOUS | Status: DC | PRN

## 2015-06-24 MED ORDER — ZOLPIDEM TARTRATE 5 MG PO TABS: 5.0000 mg | ORAL_TABLET | Freq: Every evening | ORAL | Status: DC | PRN

## 2015-06-24 MED ORDER — ACETAMINOPHEN 325 MG PO TABS: 650.0000 mg | ORAL_TABLET | ORAL | Status: DC | PRN

## 2015-06-24 MED ORDER — OXYTOCIN 10 UNIT/ML IJ SOLN: 2.5000 [IU]/h | INTRAVENOUS | Status: DC

## 2015-06-24 MED ORDER — LACTATED RINGERS IV SOLN
INTRAVENOUS | Status: DC
  Administered 2015-06-24: 07:00:00 via INTRAVENOUS

## 2015-06-24 NOTE — MAU Note (Signed)
Recheck patient in 1 hour per Dr Jimmey RalphParker

## 2015-06-24 NOTE — Lactation Note (Signed)
This note was copied from the chart of Boy Tynesha Dauenhauer. Lactation Consultation Note  Patient Name: Boy Ula Lingoseel Schwarting Today's Date: 06/24/2015 Reason for consult: Initial assessment Baby at 7 hr of life and mom is worried that baby is not eating enough. She has been offering the breast but baby is only sucking a few times then going to sleep. Baby was spitting up bubbles while LC was present. Mom stated that she can manually express and has seen colostrum bilaterally. Given spoon so that she can feed back any milk she expresses. Discussed baby behavior, feeding frequency, baby belly size, voids, wt loss, breast changes, and nipple care. Given lactation handouts. Aware of OP services and support group. LC did not see a latch and encouraged mom to call out at next feeding.     Maternal Data Has patient been taught Hand Expression?: Yes Does the patient have breastfeeding experience prior to this delivery?: Yes  Feeding Feeding Type: Breast Fed Length of feed: 2 min  LATCH Score/Interventions                      Lactation Tools Discussed/Used WIC Program: Yes   Consult Status Consult Status: Follow-up Date: 06/25/15 Follow-up type: In-patient    Rulon Eisenmengerlizabeth E Farid Grigorian 06/24/2015, 6:36 PM

## 2015-06-24 NOTE — H&P (Signed)
OBSTETRIC ADMISSION HISTORY AND PHYSICAL  Erin Newman is a 20 y.o. female G3P1011 with IUP at [redacted]w[redacted]d by L presenting for Contractions. She reports +FMs, No LOF, no VB, no blurry vision, headaches or peripheral edema, and RUQ pain.  She plans on Breast feeding. She is undecided for birth control.  Dating: By L --->  Estimated Date of Delivery: 06/20/15  Sono:    8/15 -> Single IUP at [redacted]w[redacted]d. Normal fetal anatomic survey. Posterior placenta without previa.   Prenatal History/Complications: GBS positive  Past Medical History: Past Medical History  Diagnosis Date  . B12 deficiency   . Rapid heart beat   . Miscarriage   . Medical history non-contributory     Past Surgical History: Past Surgical History  Procedure Laterality Date  . No past surgeries      Obstetrical History: OB History    Gravida Para Term Preterm AB TAB SAB Ectopic Multiple Living   3 1 1  0 1 0 1 0 0 1      Social History: Social History   Social History  . Marital Status: Married    Spouse Name: N/A  . Number of Children: N/A  . Years of Education: N/A   Social History Main Topics  . Smoking status: Never Smoker   . Smokeless tobacco: Never Used  . Alcohol Use: No  . Drug Use: No  . Sexual Activity: Yes    Birth Control/ Protection: None   Other Topics Concern  . None   Social History Narrative    Family History: Family History  Problem Relation Age of Onset  . Hypertension Mother   . Hypertension Father     Allergies: No Known Allergies  Prescriptions prior to admission  Medication Sig Dispense Refill Last Dose  . acetaminophen (TYLENOL) 500 MG tablet Take 1 tablet (500 mg total) by mouth every 6 (six) hours as needed. 30 tablet 0   . Prenatal Vit-Fe Fumarate-FA (PRENATAL MULTIVITAMIN) TABS tablet Take 1 tablet by mouth daily at 12 noon.   05/05/2015 at Unknown time     Review of Systems   All systems reviewed and negative except as stated in HPI  Blood pressure 126/58,  pulse 82, temperature 97.3 F (36.3 C), temperature source Oral, resp. rate 18, height 5\' 1"  (1.549 m), weight 160 lb (72.576 kg), last menstrual period 09/13/2014, SpO2 100 %. General appearance: alert, cooperative and no distress Lungs: clear to auscultation bilaterally Heart: regular rate and rhythm Abdomen: soft, non-tender; bowel sounds normal Extremities: Homans sign is negative, no sign of DVT DTR's WNL Presentation: cephalic Fetal monitoringBaseline: 135 bpm, Variability: Good {> 6 bpm), Accelerations: Reactive and Decelerations: Absent Uterine activity: Every 3-5 minutes.   Dilation: 5 Effacement (%): 90 Station: -1 Exam by:: D Simpson RN   Prenatal labs: ABO, Rh:  O POS Antibody:  NEG Rubella: Immune RPR:   NR HBsAg:   NEG HIV: Non Reactive (07/17 1128)  GBS: Positive (07/17 0000)  1 hr Glucola 78 Genetic screening  CF and Quad Neg Anatomy US wnl  Prenatal Transfer Tool  Maternal Diabetes: No Genetic Screening: Normal Maternal Ultrasounds/Referrals: Normal Fetal Ultrasounds or other Referrals:  None Maternal Substance Abuse:  No Significant Maternal Medications:  None Significant Maternal Lab Results: Lab values include: Group B Strep positive  No results found for this or any previous visit (from the past 24 hour(s)).  Patient Active Problem List   Diagnosis Date Noted  . Active labor 06/14/2013  . GBS (group B Streptococcus  carrier), +RV culture, currently pregnant 06/05/2013  . Insufficient prenatal care in third trimester 04/08/2013  . B12 deficiency 04/08/2013    Assessment: Erin Newman is a 20 y.o. G3P1011 at 2948w4d here for active labor  #Labor:Expectant management, consider AROM to augment #Pain: Prn meds #FWB: Cat i #ID:  GBS pos bacteruria, PCN  #MOF: Breast #MOC: Undecided #Circ:  Outpatient.  [facility]rin [facility] 06/24/2015, 6:20 AM

## 2015-06-24 NOTE — Progress Notes (Signed)
Labor Progress Note  Erin Newman is a 20 y.o. G3P1011 at 7630w4d  admitted for active labor  S:  Patient is doing fine. Patient notes of significant pain during contractions but would like to avoid pharmacologic management of pain for now.   O:  BP 110/65 mmHg  Pulse 84  Temp(Src) 97.6 F (36.4 C) (Oral)  Resp 18  Ht 5\' 1"  (1.549 m)  Wt 72.576 kg (160 lb)  BMI 30.25 kg/m2  SpO2 100%  LMP 09/13/2014 (Exact Date)     FHT:  FHR: 135 bpm, variability: moderate,  accelerations:  Present,  decelerations: absent UC:  Every 6 mins SVE:   Dilation: 6 Effacement (%): 80 Station: -1, 0 Exam by:: patti moore rn  Intact membrane   Labs: Lab Results  Component Value Date   WBC 10.8* 06/24/2015   HGB 12.9 06/24/2015   HCT 37.5 06/24/2015   MCV 89.3 06/24/2015   PLT 146* 06/24/2015    Assessment / Plan: 20 y.o. G3P1011 4830w4d in active labor  Spontaneous labor, progressing normally  Labor: Progressing normally  Fetal Wellbeing:  Category I Pain Control:  Labor support without medications (patient would like to attempt labor without medications)  Anticipated MOD:  NSVD  Expectant management   Erin HolterKanishka G Gunadasa, MD PGY 1 Family Medicine

## 2015-06-24 NOTE — Lactation Note (Signed)
This note was copied from the chart of Boy Lissa Lamson. Lactation Consultation Note  Patient Name: Boy Dalton Mille [id number] Date: 06/24/2015 Reason for consult: Follow-up assessment RN requested LC visit mom again to explain infant feeding. Upon entry mom had baby latched to the L breast. Baby was on for about 5 minutes and when he came off mom was able to latch him to the R breast. Baby was comfortably feeding when LC left the room. Reminded mom of baby behavior and belly size. Encouraged her to keep offering the breast, he will eat. Discussed the risk of formula feeding. Mom voiced understanding. Report given to RN.   Maternal Data    Feeding Feeding Type: Breast Fed Length of feed: 15 min  LATCH Score/Interventions Latch: Repeated attempts needed to sustain latch, nipple held in mouth throughout feeding, stimulation needed to elicit sucking reflex. Intervention(s): Adjust position;Breast compression  Audible Swallowing: Spontaneous and intermittent  Type of Nipple: Everted at rest and after stimulation  Comfort (Breast/Nipple): Soft / non-tender     Hold (Positioning): Assistance needed to correctly position infant at breast and maintain latch. Intervention(s): Support Pillows;Position options;Skin to skin  LATCH Score: 8  Lactation Tools Discussed/Used     Consult Status Consult Status: Follow-up Date: 06/25/15 Follow-up type: In-patient    Rulon Eisenmenger 06/24/2015, 10:33 PM

## 2015-06-24 NOTE — MAU Note (Signed)
Pt reports contractions, denies bleeding or ROM.  

## 2015-06-25 NOTE — Lactation Note (Signed)
This note was copied from the chart of Erin Newman. Lactation Consultation Note  Mother latching baby  on L side upon entering in cradle hold.  Helped w/ pillows to bring baby to nipple height. Sucks and a few swallows observed.  Baby comes off and on.  Encouraged mother to support her breast and massage. Baby fell asleep after 10 min of feeding.  Burped baby and  helped w/ diaper change. Mother then re-latched baby on R side.  Mother complaining of pain when first latching. Assisted w/ latching deeper on breast and chin tug resulting in increased comfort. Mother requested comfort gels.  Provided education. FOB stated that at 2 months mother's breastmilk "dried up".  Discussed supply and demand. Suggest mother hand express before latching to increase her milk supply. Suggest mother call lactation phone number if she starts noticing supply decrease so we can offer suggestions. Mother states she has been given manual pump.    Patient Name: Erin Newman [id number]'s Date: 06/25/2015 Reason for consult: Follow-up assessment   Maternal Data    Feeding Feeding Type: Breast Fed Length of feed: 15 min  LATCH Score/Interventions Latch: Grasps breast easily, tongue down, lips flanged, rhythmical sucking.  Audible Swallowing: A few with stimulation  Type of Nipple: Everted at rest and after stimulation  Comfort (Breast/Nipple): Filling, red/small blisters or bruises, mild/mod discomfort  Problem noted: Mild/Moderate discomfort  Hold (Positioning): Assistance needed to correctly position infant at breast and maintain latch.  LATCH Score: 7  Lactation Tools Discussed/Used     Consult Status Consult Status: Follow-up Date: 06/26/15 Follow-up type: In-patient    Erin Newman, Erin Newman Erin Newman 06/25/2015, 1:20 PM

## 2015-06-25 NOTE — Progress Notes (Signed)
Post Partum Day 1  Subjective:  Erin Newman is a 20 y.o. [id number] 5013w4d s/p SVD.  No acute events overnight.  Pt denies problems with ambulating, voiding or po intake.  She denies nausea or vomiting.  Pain is well controlled.  She has had flatus. She has not had bowel movement.  Lochia Small.  Plan for birth control is IUD.  Method of Feeding: breast  Objective: BP 110/47 mmHg  Pulse 53  Temp(Src) 97.8 F (36.6 C) (Oral)  Resp 18  Ht 5\' 1"  (1.549 m)  Wt 72.576 kg (160 lb)  BMI 30.25 kg/m2  SpO2 100%  LMP 09/13/2014 (Exact Date)  Breastfeeding? Unknown  Physical Exam:  General: alert, cooperative and no distress Lochia:normal flow Chest: CTAB Heart: RRR no m/r/g Abdomen: +BS, soft, nontender, fundus firm at/below umbilicus Uterine Fundus: firm DVT Evaluation: No evidence of DVT seen on physical exam. Extremities: no LE edema   Recent Labs  06/24/15 0645  HGB 12.9  HCT 37.5    Assessment/Plan:  ASSESSMENT: Erin Newman is a 20 y.o. [id number] 313w4d ppd #1 s/p NSVD doing well.   Plan for discharge tomorrow   LOS: 1 day   Palma HolterKanishka G Gunadasa 06/25/2015, 8:02 AM   CNM attestation Post Partum Day #1 I have seen and examined this patient and agree with above documentation in the resident's note.   Erin Newman is a 20 y.o. [id number] s/p SVD.  Pt denies problems with ambulating, voiding or po intake. Pain is well controlled.  Plan for birth control is undecided.  Method of Feeding: breast  PE:  BP 110/47 mmHg  Pulse 53  Temp(Src) 97.8 F (36.6 C) (Oral)  Resp 18  Ht 5\' 1"  (1.549 m)  Wt 72.576 kg (160 lb)  BMI 30.25 kg/m2  SpO2 100%  LMP 09/13/2014 (Exact Date)  Breastfeeding? Unknown Fundus firm  Plan for discharge: 06/26/15  Cam HaiSHAW, KIMBERLY, CNM 9:19 AM  06/25/2015

## 2015-06-26 ENCOUNTER — Ambulatory Visit (HOSPITAL_COMMUNITY): Admission: RE | Admit: 2015-06-26 | Payer: Self-pay | Source: Ambulatory Visit

## 2015-06-26 MED ORDER — ACETAMINOPHEN 325 MG PO TABS: 650.0000 mg | ORAL_TABLET | ORAL | Status: DC | PRN

## 2015-06-26 MED ORDER — SENNOSIDES-DOCUSATE SODIUM 8.6-50 MG PO TABS: 1.0000 | ORAL_TABLET | Freq: Every day | ORAL | Status: DC

## 2015-06-26 MED ORDER — IBUPROFEN 600 MG PO TABS: 600.0000 mg | ORAL_TABLET | Freq: Four times a day (QID) | ORAL | Status: DC | PRN

## 2015-06-26 NOTE — Lactation Note (Signed)
This note was copied from the chart of Boy Lillianne Sleeper. Lactation Consultation Note  Patient Name: Boy Ula Lingoseel Bingman ONGEX'BToday's Date: 06/26/2015 Reason for consult: Follow-up assessment  With this mom and term baby, now 9350 hours old. The baby was breast feeding, and coughing from getting so much milk. Breast care reviewed with mom, and she denies any further concerns/questions. Mom knows to call as needed.      Maternal Data    Feeding Feeding Type: Breast Fed  LATCH Score/Interventions Latch: Grasps breast easily, tongue down, lips flanged, rhythmical sucking.  Audible Swallowing: Spontaneous and intermittent  Type of Nipple: Everted at rest and after stimulation  Comfort (Breast/Nipple): Soft / non-tender     Hold (Positioning): No assistance needed to correctly position infant at breast. Intervention(s): Breastfeeding basics reviewed  LATCH Score: 10  Lactation Tools Discussed/Used     Consult Status Consult Status: Complete Follow-up type: Call as needed    Alfred LevinsLee, Sarahbeth Cashin Anne 06/26/2015, 1:40 PM

## 2015-06-26 NOTE — Discharge Instructions (Signed)

## 2015-06-26 NOTE — Discharge Summary (Signed)
OB Discharge Summary     Patient Name: Erin Newman DOB: [birth year] MRN: [id number]  Date of admission: 06/24/2015 Delivering MD: Reva Bores   Date of discharge: 06/26/2015  Admitting diagnosis: 40.4 WEEKS CTX Intrauterine pregnancy: [redacted]w[redacted]d     Secondary diagnosis:  Active Problems:   Active labor  Additional problems: none     Discharge diagnosis: Term Pregnancy Delivered                                                                                                Post partum procedures:none  Augmentation: AROM  Complications: None  Hospital course:  Onset of Labor With Vaginal Delivery     20 y.o. yo [id number] at [redacted]w[redacted]d was admitted in Active Labor on 06/24/2015. Patient had an uncomplicated labor course as follows:  Membrane Rupture Time/Date: 11:19 AM ,06/24/2015   Intrapartum Procedures: Episiotomy: None [1]                                         Lacerations:  None [1]  Patient had a delivery of a Viable infant. 06/24/2015  Information for the patient's newborn:  Christyn, Gutkowski [[id number]]  Delivery Method: Vaginal, Spontaneous Delivery (Filed from Delivery Summary)     Pateint had an uncomplicated postpartum course.  She is ambulating, tolerating a regular diet, passing flatus, and urinating well. Patient is discharged home in stable condition on 06/26/2015.    Physical exam  Filed Vitals:   06/25/15 0315 06/25/15 0615 06/25/15 1755 06/26/15 0610  BP: 112/57 110/47 112/56 112/72  Pulse: 84 53 82 69  Temp: 98.2 F (36.8 C) 97.8 F (36.6 C) 98 F (36.7 C) 98.2 F (36.8 C)  TempSrc: Oral Oral Oral Oral  Resp: 18 18 18 20   Height:      Weight:      SpO2:    100%   General: alert, cooperative and no distress Uterine Fundus: firm Incision: N/A DVT Evaluation: No evidence of DVT seen on physical exam. Negative Homan's sign. No cords or calf tenderness. No significant calf/ankle edema. Labs: Lab Results  Component Value Date   WBC 10.8*  06/24/2015   HGB 12.9 06/24/2015   HCT 37.5 06/24/2015   MCV 89.3 06/24/2015   PLT 146* 06/24/2015   CMP Latest Ref Rng 03/15/2014  Glucose 70 - 99 mg/dL 130(Q)  BUN 6 - 23 mg/dL 12  Creatinine 6.57 - 8.46 mg/dL 9.62  Sodium 952 - 841 mEq/L 140  Potassium 3.7 - 5.3 mEq/L 3.4(L)  Chloride 96 - 112 mEq/L 106  CO2 19 - 32 mEq/L 18(L)  Calcium 8.4 - 10.5 mg/dL 8.5    Discharge instruction: per After Visit Summary and "Baby and Me Booklet".  After visit meds:    Medication List    TAKE these medications        acetaminophen 325 MG tablet  Commonly known as:  TYLENOL  Take 2 tablets (650 mg total) by mouth every 4 (four) hours as needed (for pain  scale < 4).     ibuprofen 600 MG tablet  Commonly known as:  ADVIL,MOTRIN  Take 1 tablet (600 mg total) by mouth every 6 (six) hours as needed (pain).     prenatal multivitamin Tabs tablet  Take 1 tablet by mouth daily at 12 noon.     senna-docusate 8.6-50 MG tablet  Commonly known as:  Senokot-S  Take 1 tablet by mouth at bedtime.        Diet: routine diet  Activity: Advance as tolerated. Pelvic rest for 6 weeks.   Outpatient follow up:6 weeks Follow up Appt:No future appointments. Follow up Visit:No Follow-up on file.  Postpartum contraception: IUD Mirena  Newborn Data: Live born female  Birth Weight: 7 lb 7.9 oz (3400 g) APGAR: 9, 9  Baby Feeding: Breast Disposition:home with mother   06/26/2015 Palma HolterKanishka G Gunadasa, MD  Seen and examined by me also FFirm Lochia WNL Ext WNL Discharge home RTC 4 wks Aviva SignsMarie L Sheikh Leverich, CNM

## 2015-06-27 ENCOUNTER — Inpatient Hospital Stay (HOSPITAL_COMMUNITY): Admission: RE | Admit: 2015-06-27 | Payer: Self-pay | Source: Ambulatory Visit

## 2015-08-06 ENCOUNTER — Ambulatory Visit (INDEPENDENT_AMBULATORY_CARE_PROVIDER_SITE_OTHER): Payer: Self-pay | Admitting: Obstetrics & Gynecology

## 2015-08-06 ENCOUNTER — Encounter: Payer: Self-pay | Admitting: Obstetrics & Gynecology

## 2015-08-06 NOTE — Progress Notes (Signed)
Subjective:     Erin Newman is a 20 y.o. G81P2012 female who presents for a postpartum visit. She is 6 weeks postpartum following a spontaneous vaginal delivery. I have fully reviewed the prenatal and intrapartum course. The delivery was at 40 gestational weeks. Outcome: spontaneous vaginal delivery. Anesthesia: none. Postpartum course has been uncomplicated. Baby's course has been uncomplicated. Baby is feeding by breast. Bleeding staining only. Bowel function is normal. Bladder function is normal. Patient is not sexually active. Contraception method is none now, will use condoms as needed until she gets IUD at the [facility]. Postpartum depression screening: negative.  The following portions of the patient's history were reviewed and updated as appropriate: allergies, current medications, past family history, past medical history, past social history, past surgical history and problem list.  Review of Systems Pertinent items noted in HPI and remainder of comprehensive ROS otherwise negative.   Objective:    BP 121/74 mmHg  Pulse 79  Temp(Src) 98.4 F (36.9 C) (Oral)  Ht  (1.702 m)  Wt 139 lb 8 oz (63.277 kg)  BMI 21.84 kg/m2  Breastfeeding? Yes  General:  alert and no distress   Breasts:  inspection negative, no nipple discharge or bleeding, no masses or nodularity palpable  Lungs: clear to auscultation bilaterally  Heart:  regular rate and rhythm  Abdomen: soft, non-tender; bowel sounds normal; no masses,  no organomegaly   Pelvic:  not evaluated        Assessment:   Normal postpartum exam.   Plan:   1. Contraception: will use condoms as needed until she gets IUD at the [facility].  2. Follow up as needed.    Jaynie Collins, MD, FACOG Attending Obstetrician & Gynecologist, [facility]

## 2015-08-06 NOTE — Progress Notes (Signed)
Pt given info to contact [facility] for [facility] due to not having insurance.

## 2015-08-06 NOTE — Patient Instructions (Signed)
Return to clinic for any scheduled appointments or for any gynecologic concerns as needed.   

## 2016-03-13 ENCOUNTER — Encounter (HOSPITAL_COMMUNITY): Payer: Self-pay | Admitting: Emergency Medicine

## 2016-03-13 ENCOUNTER — Emergency Department (HOSPITAL_COMMUNITY)
Admission: EM | Admit: 2016-03-13 | Discharge: 2016-03-13 | Disposition: A | Discharge status: Home / Self Care | Payer: Self-pay | Attending: Emergency Medicine | Admitting: Emergency Medicine

## 2016-03-13 DIAGNOSIS — X500XXA Overexertion from strenuous movement or load, initial encounter: Secondary | ICD-10-CM | POA: Insufficient documentation

## 2016-03-13 DIAGNOSIS — Y999 Unspecified external cause status: Secondary | ICD-10-CM | POA: Insufficient documentation

## 2016-03-13 DIAGNOSIS — Y929 Unspecified place or not applicable: Secondary | ICD-10-CM | POA: Insufficient documentation

## 2016-03-13 DIAGNOSIS — Y9389 Activity, other specified: Secondary | ICD-10-CM | POA: Insufficient documentation

## 2016-03-13 DIAGNOSIS — T148XXA Other injury of unspecified body region, initial encounter: Secondary | ICD-10-CM

## 2016-03-13 DIAGNOSIS — S29012A Strain of muscle and tendon of back wall of thorax, initial encounter: Secondary | ICD-10-CM | POA: Insufficient documentation

## 2016-03-13 MED ORDER — CYCLOBENZAPRINE HCL 10 MG PO TABS: 10.0000 mg | ORAL_TABLET | Freq: Two times a day (BID) | ORAL | 0 refills | Status: DC | PRN

## 2016-03-13 MED ORDER — IBUPROFEN 200 MG PO TABS
600.0000 mg | ORAL_TABLET | Freq: Once | ORAL | Status: AC
  Administered 2016-03-13: 600 mg via ORAL
  Filled 2016-03-13: qty 3

## 2016-03-13 MED ORDER — DICLOFENAC SODIUM 1 % TD GEL: 2.0000 g | Freq: Four times a day (QID) | TRANSDERMAL | 0 refills | Status: DC

## 2016-03-13 MED ORDER — NAPROXEN 500 MG PO TABS: 500.0000 mg | ORAL_TABLET | Freq: Two times a day (BID) | ORAL | 0 refills | Status: DC

## 2016-03-13 NOTE — Discharge Instructions (Signed)
Take Naprosyn as needed for pain. Take muscle relaxer at night before bed. Apply get as needed for pain. Avoid heavy lifting. Increase mobility. Follow up with PCP as needed. Return to the ED if you experience inability to walk, weakness, numbness or tingling in your legs, loss of control of your bowel or bladder.

## 2016-03-13 NOTE — ED Triage Notes (Signed)
Pt c/o mid back pain, started after lifting daughter yesterday-- states was unable to stand straight up-- also c/o having a headache

## 2016-03-13 NOTE — ED Notes (Signed)
For cultural reasons, Pt's husband is requesting only females perform exams

## 2016-03-13 NOTE — ED Provider Notes (Signed)
[facility]EM[facility] DEPT Provider Note   CSN: [id number] Arrival date & time: 03/13/16  1202     History   Chief Complaint Chief Complaint  Patient presents with  . Back Pain    HPI Erin Newman is a 20 y.o. female with no significant pmhx who presents to the ED today c.o back pain. Pt states that yesterday she bent down to pick up her 76 y.o daughter when she felt sudden onset leftsided mid back pain. Pt states pain has persisted and is worse with movement. Pt has difficulty sleeping last night due to pain. She has not tried taking anything to relieve her symptoms. Pt has been ambulatory without difficulty since then. Denies bowel or bladder incontinence, saddle anesthesia.   HPI  Past Medical History:  Diagnosis Date  . B12 deficiency   . Medical history non-contributory   . Miscarriage   . Rapid heart beat     Patient Active Problem List   Diagnosis Date Noted  . B12 deficiency 04/08/2013    Past Surgical History:  Procedure Laterality Date  . NO PAST SURGERIES      OB History    Gravida Para Term Preterm AB Living   3 2 2  0 1 2   SAB TAB Ectopic Multiple Live Births   1 0 0 0 2       Home Medications    Prior to Admission medications   Medication Sig Start Date End Date Taking? Authorizing Provider  acetaminophen (TYLENOL) 325 MG tablet Take 2 tablets (650 mg total) by mouth every 4 (four) hours as needed (for pain scale < 4). Patient not taking: Reported on 08/06/2015 06/26/15   Palma Holter, MD  cyclobenzaprine (FLEXERIL) 10 MG tablet Take 1 tablet (10 mg total) by mouth 2 (two) times daily as needed for muscle spasms. 03/13/16   Samantha Tripp Dowless, PA-C  diclofenac sodium (VOLTAREN) 1 % GEL Apply 2 g topically 4 (four) times daily. 03/13/16   Samantha Tripp Dowless, PA-C  ibuprofen (ADVIL,MOTRIN) 600 MG tablet Take 1 tablet (600 mg total) by mouth every 6 (six) hours as needed (pain). Patient not taking: Reported on 08/06/2015 06/26/15   Palma Holter, MD  naproxen (NAPROSYN) 500 MG tablet Take 1 tablet (500 mg total) by mouth 2 (two) times daily. 03/13/16   Samantha Tripp Dowless, PA-C  Prenatal Vit-Fe Fumarate-FA (PRENATAL MULTIVITAMIN) TABS tablet Take 1 tablet by mouth daily at 12 noon.    Historical Provider, MD  senna-docusate (SENOKOT-S) 8.6-50 MG tablet Take 1 tablet by mouth at bedtime. Patient not taking: Reported on 08/06/2015 06/26/15   Palma Holter, MD    Family History Family History  Problem Relation Age of Onset  . Hypertension Mother   . Hypertension Father     Social History Social History  Substance Use Topics  . Smoking status: Never Smoker  . Smokeless tobacco: Never Used  . Alcohol use No     Allergies   Review of patient's allergies indicates no known allergies.   Review of Systems Review of Systems  All other systems reviewed and are negative.    Physical Exam Updated Vital Signs BP 117/75 (BP Location: Right Arm)   Pulse 63   Temp 98 F (36.7 C) (Oral)   Resp 20   Ht 5\' 5"  (1.651 m)   Wt 59 kg   LMP 02/20/2016   SpO2 98%   BMI 21.64 kg/m   Physical Exam  Constitutional: She is oriented to person,  place, and time. She appears well-developed and well-nourished. No distress.  HENT:  Head: Normocephalic and atraumatic.  Eyes: Conjunctivae are normal. Right eye exhibits no discharge. Left eye exhibits no discharge. No scleral icterus.  Cardiovascular: Normal rate.   Pulmonary/Chest: Effort normal.  Musculoskeletal:  TTP of left thoracic paraspinal muscles. No midline spinal TTP. FROM of C, T, L spine. No step offs or obvious bony deformities. Negative SLR.    Neurological: She is alert and oriented to person, place, and time. Coordination normal.  Strength 5/5 throughout. No sensory deficits. No gait abnormality.  Skin: Skin is warm and dry. No rash noted. She is not diaphoretic. No erythema. No pallor.  Psychiatric: She has a normal mood and affect. Her behavior is  normal.  Nursing note and vitals reviewed.    ED Treatments / Results  Labs (all labs ordered are listed, but only abnormal results are displayed) Labs Reviewed - No data to display  EKG  EKG Interpretation None       Radiology No results found.  Procedures Procedures (including critical care time)  Medications Ordered in ED Medications  ibuprofen (ADVIL,MOTRIN) tablet 600 mg (600 mg Oral Given 03/13/16 1305)     Initial Impression / Assessment and Plan / ED Course  I have reviewed the triage vital signs and the nursing notes.  Pertinent labs & imaging results that were available during my care of the patient were reviewed by me and considered in my medical decision making (see chart for details).  Clinical Course    Patient with back pain, likely muscle strain given hx and physical exam.  No neurological deficits and normal neuro exam.  Patient can walk without difficulty.  No loss of bowel or bladder control.  No concern for cauda equina.  No fever, night sweats, weight loss, h/o cancer, IVDU. Will d/c with naprosyn and voltaren gel. Follow up with PCP as needed. Return precautions outlined in patient discharge instructions.      Final Clinical Impressions(s) / ED Diagnoses   Final diagnoses:  Muscle strain    New Prescriptions Discharge Medication List as of 03/13/2016 12:54 PM    START taking these medications   Details  cyclobenzaprine (FLEXERIL) 10 MG tablet Take 1 tablet (10 mg total) by mouth 2 (two) times daily as needed for muscle spasms., Starting Sun 03/13/2016, Print    diclofenac sodium (VOLTAREN) 1 % GEL Apply 2 g topically 4 (four) times daily., Starting Sun 03/13/2016, Print    naproxen (NAPROSYN) 500 MG tablet Take 1 tablet (500 mg total) by mouth 2 (two) times daily., Starting Sun 03/13/2016, [employer]         Samantha Tripp Dowless, PA-C 03/13/16 1513    Jacalyn LefevreJulie Haviland, MD 03/15/16 1626

## 2016-08-08 ENCOUNTER — Emergency Department (HOSPITAL_COMMUNITY)
Admission: EM | Admit: 2016-08-08 | Discharge: 2016-08-08 | Disposition: A | Discharge status: Home / Self Care | Payer: Self-pay | Attending: Emergency Medicine | Admitting: Emergency Medicine

## 2016-08-08 ENCOUNTER — Emergency Department (HOSPITAL_COMMUNITY): Payer: Self-pay

## 2016-08-08 ENCOUNTER — Encounter (HOSPITAL_COMMUNITY): Payer: Self-pay | Admitting: *Deleted

## 2016-08-08 DIAGNOSIS — R002 Palpitations: Secondary | ICD-10-CM | POA: Insufficient documentation

## 2016-08-08 LAB — BASIC METABOLIC PANEL
Anion gap: 10 (ref 5–15)
BUN: 12 mg/dL (ref 6–20)
CHLORIDE: 104 mmol/L (ref 101–111)
CO2: 24 mmol/L (ref 22–32)
CREATININE: 0.58 mg/dL (ref 0.44–1.00)
Calcium: 9.1 mg/dL (ref 8.9–10.3)
GFR calc Af Amer: >60 mL/min (ref >60)
GFR calc non Af Amer: >60 mL/min (ref >60)
GLUCOSE: 96 mg/dL (ref 65–99)
POTASSIUM: 3.7 mmol/L (ref 3.5–5.1)
SODIUM: 138 mmol/L (ref 135–145)

## 2016-08-08 LAB — CBC
HEMATOCRIT: 38.7 % (ref 36.0–46.0)
Hemoglobin: 13 g/dL (ref 12.0–15.0)
MCH: 28 pg (ref 26.0–34.0)
MCHC: 33.6 g/dL (ref 30.0–36.0)
MCV: 83.2 fL (ref 78.0–100.0)
PLATELETS: 206 10*3/uL (ref 150–400)
RBC: 4.65 MIL/uL (ref 3.87–5.11)
RDW: 13.5 % (ref 11.5–15.5)
WBC: 6.1 10*3/uL (ref 4.0–10.5)

## 2016-08-08 LAB — TROPONIN I: Troponin I: <0.03 ng/mL (ref <0.03)

## 2016-08-08 NOTE — Discharge Instructions (Signed)
It was our pleasure to provide your ER care today - we hope that you feel better.  Your tests in the ER look good/normal.    For palpitations, follow up with cardiologist in the next week - see referral - call office tomorrow morning to arrange appointment time - discuss possible outpatient/holter monitor then.   Return to ER if worse, persistent fast heart beat, fainting, other concern.

## 2016-08-08 NOTE — ED Provider Notes (Signed)
[facility] Provider Note   CSN: [id number] Arrival date & time: 08/08/16  1503  By signing my name below, I, Erin Newman, attest that this documentation has been prepared under the direction and in the presence of [name], MD . Electronically Signed: Marnette Burgessyan Andrew Newman, Scribe. 08/08/2016. 4:52 PM.    History   Chief Complaint Chief Complaint  Patient presents with  . Tachycardia    The history is provided by the patient. No language interpreter was used.    HPI Comments:  Erin Newman is a 21 y.o. female with a PMHx of B12 Deficiency and h/o tachycardia, who presents to the Emergency Department complaining of intermittent, gradually worsening, palpitations and chest pan onset this week. She reports she cannot pick up her son without the sharp chest pain arising. She notes these palpitations also occurred while she was pregnant with her son but was never Dx'd with anything. These episodes last for ~4-5 seconds at time during which she cannot catch her breath. Denies more prolonged episodes. No syncope.  She also states these arise randomly with and without exertion. She feels fine at the current time. No h/o heart rhythm problem. She did not take anything PTA for relief of her pain. No h/o thyroid problem. Pt denies cough, fever, neck pain, leg swelling, and any other associated symptoms at this time. LMP~ irregular 2 months ago.    Past Medical History:  Diagnosis Date  . B12 deficiency   . Medical history non-contributory   . Miscarriage   . Rapid heart beat     Patient Active Problem List   Diagnosis Date Noted  . B12 deficiency 04/08/2013    Past Surgical History:  Procedure Laterality Date  . NO PAST SURGERIES      OB History    Gravida Para Term Preterm AB Living   3 2 2  0 1 2   SAB TAB Ectopic Multiple Live Births   1 0 0 0 2       Home Medications    Prior to Admission medications   Medication Sig Start Date End Date Taking? Authorizing  Provider  acetaminophen (TYLENOL) 325 MG tablet Take 2 tablets (650 mg total) by mouth every 4 (four) hours as needed (for pain scale < 4). Patient not taking: Reported on 08/06/2015 06/26/15   Palma HolterKanishka G Gunadasa, MD  cyclobenzaprine (FLEXERIL) 10 MG tablet Take 1 tablet (10 mg total) by mouth 2 (two) times daily as needed for muscle spasms. 03/13/16   Samantha Tripp Dowless, PA-C  diclofenac sodium (VOLTAREN) 1 % GEL Apply 2 g topically 4 (four) times daily. 03/13/16   Samantha Tripp Dowless, PA-C  ibuprofen (ADVIL,MOTRIN) 600 MG tablet Take 1 tablet (600 mg total) by mouth every 6 (six) hours as needed (pain). Patient not taking: Reported on 08/06/2015 06/26/15   Palma HolterKanishka G Gunadasa, MD  naproxen (NAPROSYN) 500 MG tablet Take 1 tablet (500 mg total) by mouth 2 (two) times daily. 03/13/16   Samantha Tripp Dowless, PA-C  Prenatal Vit-Fe Fumarate-FA (PRENATAL MULTIVITAMIN) TABS tablet Take 1 tablet by mouth daily at 12 noon.    Historical Provider, MD  senna-docusate (SENOKOT-S) 8.6-50 MG tablet Take 1 tablet by mouth at bedtime. Patient not taking: Reported on 08/06/2015 06/26/15   Palma HolterKanishka G Gunadasa, MD    Family History Family History  Problem Relation Age of Onset  . Hypertension Mother   . Hypertension Father     Social History Social History  Substance Use Topics  .  Smoking status: Never Smoker  . Smokeless tobacco: Never Used  . Alcohol use No     Allergies   Patient has no known allergies.   Review of Systems Review of Systems  Constitutional: Negative for fever.  Respiratory: Positive for shortness of breath (during episodes. resolved at this time). Negative for cough.   Cardiovascular: Positive for chest pain (resolved) and palpitations. Negative for leg swelling.  Musculoskeletal: Negative for neck pain.     Physical Exam Updated Vital Signs BP 113/71 (BP Location: Right Arm)   Pulse 80   Temp 98.8 F (37.1 C) (Oral)   Resp 17   LMP 07/12/2016   SpO2 100%    Physical Exam  Constitutional: She appears well-developed and well-nourished.  HENT:  Head: Normocephalic.  Eyes: Conjunctivae are normal.  Neck: Neck supple. No thyromegaly present.  Cardiovascular: Normal rate, regular rhythm, normal heart sounds and intact distal pulses.  Exam reveals no gallop and no friction rub.   No murmur heard. Pulmonary/Chest: Effort normal and breath sounds normal.  Abdominal: She exhibits no distension.  Musculoskeletal: She exhibits no edema or tenderness.  Neurological: She is alert.  Speech clear/fluent, ambulates w steady gait.   Skin: Skin is warm and dry.  Psychiatric: She has a normal mood and affect.  Nursing note and vitals reviewed.    ED Treatments / Results  DIAGNOSTIC STUDIES:  Oxygen Saturation is 100% on RA, normal by my interpretation.    COORDINATION OF CARE:  4:49 PM Discussed treatment plan with pt at bedside including lab work and EKG and pt agreed to plan.  Labs (all labs ordered are listed, but only abnormal results are displayed) Results for orders placed or performed during the hospital encounter of 08/08/16  Basic metabolic panel  Result Value Ref Range   Sodium 138 135 - 145 mmol/L   Potassium 3.7 3.5 - 5.1 mmol/L   Chloride 104 101 - 111 mmol/L   CO2 24 22 - 32 mmol/L   Glucose, Bld 96 65 - 99 mg/dL   BUN 12 6 - 20 mg/dL   Creatinine, Ser 1.61 0.44 - 1.00 mg/dL   Calcium 9.1 8.9 - 09.6 mg/dL   GFR calc non Af Amer >60 >60 mL/min   GFR calc Af Amer >60 >60 mL/min   Anion gap 10 5 - 15  CBC  Result Value Ref Range   WBC 6.1 4.0 - 10.5 K/uL   RBC 4.65 3.87 - 5.11 MIL/uL   Hemoglobin 13.0 12.0 - 15.0 g/dL   HCT 04.5 40.9 - 81.1 %   MCV 83.2 78.0 - 100.0 fL   MCH 28.0 26.0 - 34.0 pg   MCHC 33.6 30.0 - 36.0 g/dL   RDW 91.4 78.2 - 95.6 %   Platelets 206 150 - 400 K/uL  Troponin I  Result Value Ref Range   Troponin I <0.03 <0.03 ng/mL   Dg Chest 2 View  Result Date: 08/08/2016 CLINICAL DATA:  Left side  chest pain, tachycardia for 1 week EXAM: CHEST  2 VIEW COMPARISON:  None. FINDINGS: The heart size and mediastinal contours are within normal limits. Both lungs are clear. The visualized skeletal structures are unremarkable. Mild elevation of the right anterior hemidiaphragm IMPRESSION: No active cardiopulmonary disease. Electronically Signed   By: Natasha Mead M.D.   On: 08/08/2016 16:13    EKG  EKG Interpretation  Date/Time:  Monday August 08 2016 15:10:21 EST Ventricular Rate:  85 PR Interval:  136 QRS Duration: 94 QT  Interval:  364 QTC Calculation: 433 R Axis:   87 Text Interpretation:  Normal sinus rhythm No significant change since last tracing Confirmed by Denton Lank  MD, Caryn Bee ([id number]) on 08/08/2016 4:14:59 PM       Radiology Dg Chest 2 View  Result Date: 08/08/2016 CLINICAL DATA:  Left side chest pain, tachycardia for 1 week EXAM: CHEST  2 VIEW COMPARISON:  None. FINDINGS: The heart size and mediastinal contours are within normal limits. Both lungs are clear. The visualized skeletal structures are unremarkable. Mild elevation of the right anterior hemidiaphragm IMPRESSION: No active cardiopulmonary disease. Electronically Signed   By: Natasha Mead M.D.   On: 08/08/2016 16:13    Procedures Procedures (including critical care time)  Medications Ordered in ED Medications - No data to display   Initial Impression / Assessment and Plan / ED Course  I have reviewed the triage vital signs and the nursing notes.  Pertinent labs & imaging results that were available during my care of the patient were reviewed by me and considered in my medical decision making (see chart for details).  Reviewed nursing notes and prior charts for additional history.   Cardiac monitor. Labs. Ecg.   ecg without significant change from prior.  Patient indicates episodes only last 4-5 seconds. No syncope. Patient is currently asymptomatic.    Will refer to outpt cardiology f/u, possible outpt holter.      Final Clinical Impressions(s) / ED Diagnoses   Final diagnoses:  None    New Prescriptions New Prescriptions   No medications on file   I personally performed the services described in this documentation, which was scribed in my presence. The recorded information has been reviewed and considered. Cathren Laine, MD     Cathren Laine, MD 08/08/16 [id number]

## 2016-08-08 NOTE — ED Triage Notes (Signed)
The pt has had episodes of a rapid heart rate and some lt chest pain intermittent since  She had her last baby about a year ago.  Worse for the past week.  She is very tired and cannot pick up her son without sharp pains in her chest no pain at present.  Every am she feels like her heart is beating irregularly and she feels like she cannot breathe  lmp irregular  2 months ago

## 2016-08-23 DIAGNOSIS — R002 Palpitations: Secondary | ICD-10-CM | POA: Insufficient documentation

## 2016-08-23 NOTE — Progress Notes (Signed)
Cardiology Office Note    Date:  08/24/2016   ID:  Erin Newman, DOB April 11, 1996, MRN [id number]  PCP:  Caren Griffinseirdre Poe, CNM  Cardiologist: New- discussed case with Dr Anne FuSkains  CC: palpitations  History of Present Illness:  Erin Newman is a 21 y.o. female with a history of vit B deficinecy who presents to clinic for evaluation of palpitations.    [facility]/16 for evaluation of palpitations. Lab work inclucing CBC and BMET were normal.  She was referred for outpatient cardiology follow up.   Today she presents to clinic for follow up.  She feels like her heart skips, stops and races. This is worse with exertion, but can also wake her from sleep. When she is running up stairs or cleaning her house she notices it most. She does feel associated SOB. Sometimes associated with dizziness but no syncope. It lasts only seconds, longest ~4 seconds. Nothing makes it better or worse. It always self resolved. She has noticed this over the last year but more so over the last month. No chest pain. No LE edema, orthopnea or PND. No blood in stool or urine. An Arabic language interpreter was used during our visit.    Past Medical History:  Diagnosis Date  . B12 deficiency   . Medical history non-contributory   . Miscarriage   . Rapid heart beat     Past Surgical History:  Procedure Laterality Date  . NO PAST SURGERIES      Current Medications: Outpatient Medications Prior to Visit  Medication Sig Dispense Refill  . acetaminophen (TYLENOL) 325 MG tablet Take 2 tablets (650 mg total) by mouth every 4 (four) hours as needed (for pain scale < 4). (Patient not taking: Reported on 08/06/2015) 30 tablet 2  . cyclobenzaprine (FLEXERIL) 10 MG tablet Take 1 tablet (10 mg total) by mouth 2 (two) times daily as needed for muscle spasms. (Patient not taking: Reported on 08/24/2016) 10 tablet 0  . diclofenac sodium (VOLTAREN) 1 % GEL Apply 2 g topically 4 (four) times daily. (Patient  not taking: Reported on 08/24/2016) 1 Tube 0  . ibuprofen (ADVIL,MOTRIN) 600 MG tablet Take 1 tablet (600 mg total) by mouth every 6 (six) hours as needed (pain). (Patient not taking: Reported on 08/06/2015) 30 tablet 2  . naproxen (NAPROSYN) 500 MG tablet Take 1 tablet (500 mg total) by mouth 2 (two) times daily. 30 tablet 0  . Prenatal Vit-Fe Fumarate-FA (PRENATAL MULTIVITAMIN) TABS tablet Take 1 tablet by mouth daily at 12 noon.    . senna-docusate (SENOKOT-S) 8.6-50 MG tablet Take 1 tablet by mouth at bedtime. (Patient not taking: Reported on 08/06/2015) 30 tablet 2   No facility-administered medications prior to visit.      Allergies:   Patient has no known allergies.   Social History   Social History  . Marital status: Married    Spouse name: N/A  . Number of children: N/A  . Years of education: N/A   Social History Main Topics  . Smoking status: Never Smoker  . Smokeless tobacco: Never Used  . Alcohol use No  . Drug use: No  . Sexual activity: Yes    Birth control/ protection: None   Other Topics Concern  . None   Social History Narrative  . None     Family History:  The patient's family history includes Hypertension in her father and mother.      ROS:   Please see  the history of present illness.    ROS All other systems reviewed and are negative.   PHYSICAL EXAM:   VS:  BP 98/70   Pulse 70   Ht 5\' 5"  (1.651 m)   Wt 134 lb (60.8 kg)   BMI 22.30 kg/m    GEN: Well nourished, well developed, in no acute distress  HEENT: normal  Neck: no JVD, carotid bruits, or masses Cardiac: RRR; no murmurs, rubs, or gallops,no edema  Respiratory:  clear to auscultation bilaterally, normal work of breathing GI: soft, nontender, nondistended, + BS MS: no deformity or atrophy  Skin: warm and dry, no rash Neuro:  Alert and Oriented x 3, Strength and sensation are intact Psych: euthymic mood, full affect     Wt Readings from Last 3 Encounters:  08/24/16 134 lb (60.8 kg)    03/13/16 130 lb 1.1 oz (59 kg)  08/06/15 139 lb 8 oz (63.3 kg) (68 %, Z= 0.48)*   * Growth percentiles are based on CDC 2-20 Years data.      Studies/Labs Reviewed:   EKG:  EKG is NOT ordered today.  The ekg on 08/09/16 demonstrated NSR HR 85 - personally reviewed   Recent Labs: 08/08/2016: BUN 12; Creatinine, Ser 0.58; Hemoglobin 13.0; Platelets 206; Potassium 3.7; Sodium 138   Lipid Panel No results found for: CHOL, TRIG, HDL, CHOLHDL, VLDL, LDLCALC, LDLDIRECT  Additional studies/ records that were reviewed today include:  CBC and BMET from ER 08/08/16- personally reviewed  ASSESSMENT & PLAN:   Palpitations: likely PVCs or SVT or both. Will place a 30 day monitor for further evaluation and to rule out other adverse arrhythmias. Electrolytes and blood counts normal during ER visit, which I personally reviewed. Will check a TSH. If she does have PVCs or SVT will place her on a BB. She is planning on trying to get pregnant in the next year or so, so we will have to be mindful of which BB we choose. I spent a lot of time reassuring the patient and her husband   Medication Adjustments/Labs and Tests Ordered: Current medicines are reviewed at length with the patient today.  Concerns regarding medicines are outlined above.  Medication changes, Labs and Tests ordered today are listed in the Patient Instructions below. Patient Instructions  Medication Instructions:  Your physician recommends that you continue on your current medications as directed. Please refer to the Current Medication list given to you today.   Labwork: TODAY:  TSH  Testing/Procedures: Your physician has recommended that you wear an event monitor. Event monitors are medical devices that record the heart's electrical activity. Doctors most often [place] these monitors to diagnose arrhythmias. Arrhythmias are problems with the speed or rhythm of the heartbeat. The monitor is a small, portable device. You can wear one while  you do your normal daily activities. This is usually used to diagnose what is causing palpitations/syncope (passing out).    Follow-Up: Your physician recommends that you schedule a follow-up appointment in: 6 WEEKS WITH KATIE Emmelina Mcloughlin, PA-C   Any Other Special Instructions Will Be Listed Below (If Applicable).     If you need a refill on your cardiac medications before your next appointment, please call your pharmacy.      Signed, Cline Crock, PA-C  08/24/2016 9:47 AM    [facility] [address] [place], [place], [place]  [address] Phone: [phone]; Fax: [phone]

## 2016-08-24 ENCOUNTER — Encounter: Payer: Self-pay | Admitting: Physician Assistant

## 2016-08-24 ENCOUNTER — Ambulatory Visit (INDEPENDENT_AMBULATORY_CARE_PROVIDER_SITE_OTHER): Payer: Self-pay | Admitting: Physician Assistant

## 2016-08-24 ENCOUNTER — Telehealth: Payer: Self-pay | Admitting: Physician Assistant

## 2016-08-24 DIAGNOSIS — R002 Palpitations: Secondary | ICD-10-CM

## 2016-08-24 LAB — TSH: TSH: 1.08 u[IU]/mL (ref 0.450–4.500)

## 2016-08-24 NOTE — Patient Instructions (Signed)
Medication Instructions:  Your physician recommends that you continue on your current medications as directed. Please refer to the Current Medication list given to you today.   Labwork: TODAY:  TSH  Testing/Procedures: Your physician has recommended that you wear an event monitor. Event monitors are medical devices that record the heart's electrical activity. Doctors most often us these monitors to diagnose arrhythmias. Arrhythmias are problems with the speed or rhythm of the heartbeat. The monitor is a small, portable device. You can wear one while you do your normal daily activities. This is usually used to diagnose what is causing palpitations/syncope (passing out).    Follow-Up: Your physician recommends that you schedule a follow-up appointment in: 6 WEEKS WITH KATIE THOMPSON, PA-C   Any Other Special Instructions Will Be Listed Below (If Applicable).     If you need a refill on your cardiac medications before your next appointment, please call your pharmacy.

## 2016-08-24 NOTE — Telephone Encounter (Signed)
Order was placed for Event monitor.  Patient is self pay. She was giving the option to fill out [employer] or to set up payment arrangement with [employer].   Patient chose [employer] monitor - appointment schedule  for 09-01-16.  She is to call the billing department to let them know she has the monitor to set up payment within 7 days. Gave patient the number to call.

## 2016-09-01 ENCOUNTER — Ambulatory Visit: Payer: Self-pay

## 2016-09-01 ENCOUNTER — Encounter: Payer: Self-pay | Admitting: *Deleted

## 2016-09-01 NOTE — Progress Notes (Signed)
Patient ID: Erin Newman, female   DOB: 06-03-1996, 21 y.o.   MRN: [id number] Self pay patient came in for 30 day cardiac event monitor.  After given information about discounted cost and service of monitor company, patient refused test at this time.  It was felt since her symptoms occurred 3 months ago, a 30 day monitor would not catch it.  Her and her husband had the idea the monitor would be theirs to keep and use at will if symptoms arose.  Appointment was cancelled, however, patient informed if she should change her mind, the order would be in the system for a year.  Patient does have a follow up appointment with Unknown FoleyKatherine Thompson, PA-C, on 10/12/16.

## 2016-09-02 NOTE — Progress Notes (Signed)
Thanks Mark Skains, MD  

## 2016-09-06 ENCOUNTER — Encounter: Payer: Self-pay | Admitting: *Deleted

## 2016-09-10 ENCOUNTER — Emergency Department (HOSPITAL_COMMUNITY): Payer: Self-pay

## 2016-09-10 ENCOUNTER — Emergency Department (HOSPITAL_COMMUNITY)
Admission: EM | Admit: 2016-09-10 | Discharge: 2016-09-10 | Disposition: A | Discharge status: Home / Self Care | Payer: Self-pay | Attending: Emergency Medicine | Admitting: Emergency Medicine

## 2016-09-10 ENCOUNTER — Encounter (HOSPITAL_COMMUNITY): Payer: Self-pay | Admitting: *Deleted

## 2016-09-10 DIAGNOSIS — B9789 Other viral agents as the cause of diseases classified elsewhere: Secondary | ICD-10-CM

## 2016-09-10 DIAGNOSIS — J069 Acute upper respiratory infection, unspecified: Secondary | ICD-10-CM | POA: Insufficient documentation

## 2016-09-10 LAB — RAPID STREP SCREEN (MED CTR MEBANE ONLY): STREPTOCOCCUS, GROUP A SCREEN (DIRECT): NEGATIVE

## 2016-09-10 MED ORDER — BENZONATATE 100 MG PO CAPS: 100.0000 mg | ORAL_CAPSULE | Freq: Three times a day (TID) | ORAL | 0 refills | Status: DC

## 2016-09-10 MED ORDER — FLUTICASONE PROPIONATE 50 MCG/ACT NA SUSP: 2.0000 | Freq: Every day | NASAL | 2 refills | Status: DC

## 2016-09-10 NOTE — ED Notes (Signed)
c/o nighttime cough x 3 days, sore throat and left ear ache since last pm.

## 2016-09-10 NOTE — Discharge Instructions (Signed)
Read the information below.  Your rapid  strep was negative, it is being sent for culture if abnormal you will be notified.  Your chest x-ray was re-assuring, no evidence of pneumonia.  I suspect you may have a viral upper respiratory infection. I have prescribed medicine for cough relief.  Tylenol as well as Motrin per package instructions for pain relief.  Warm salt water gargles and OTC Cepacol drops for relief of sore throat.  Get plenty of rest and stay well hydrated. Use the prescribed medication as directed.  Please discuss all new medications with your pharmacist.  Please follow up with your primary provider next week for re-evaluation.   You may return to the Emergency Department at any time for worsening condition or any new symptoms that concern you. Return to ED if develop fever, neck stiffness, unable to open mouth, drooling, trouble breathing, pain in chest, or any other new/concerning symptoms.

## 2016-09-10 NOTE — ED Provider Notes (Signed)
[facility]-EMERGENCY DEPT Provider Note    By signing my name below, I, Earmon Phoenix, attest that this documentation has been prepared under the direction and in the presence of Arvilla Meres, PA-C. Electronically Signed: Earmon Phoenix, ED Scribe. 09/10/16. 1:14 PM.    History   Chief Complaint Chief Complaint  Patient presents with  . Sore Throat  . Cough   The history is provided by the patient and medical records. No language interpreter was used.    Erin Newman is a 21 y.o. female who presents to the Emergency Department complaining of left sided sore throat that began last night. She reports associated left ear pain that also began last night. She is able to swallow; however, painful. Also complains of cough that began three days ago. She states the cough is productive during the day and dry and worse at night. She reports intermittent HA. Associated cervical lymph node tenderness. She has not taken anything for pain relief. There are no alleviating factors noted. She denies fever, chills, nausea, vomiting, abdominal pain, rhinorrhea, nasal congestion, body aches, inability to swallow or difficulty breathing, wheezing, CP, itching/watery eyes, LOC.   Past Medical History:  Diagnosis Date  . B12 deficiency   . Medical history non-contributory   . Miscarriage   . Rapid heart beat     Patient Active Problem List   Diagnosis Date Noted  . Palpitation 08/23/2016  . B12 deficiency 04/08/2013    Past Surgical History:  Procedure Laterality Date  . NO PAST SURGERIES      OB History    Gravida Para Term Preterm AB Living   0 1 2   SAB TAB Ectopic Multiple Live Births   1 0 0 0 2       Home Medications    Prior to Admission medications   Medication Sig Start Date End Date Taking? Authorizing Provider  benzonatate (TESSALON) 100 MG capsule Take 1 capsule (100 mg total) by mouth every 8 (eight) hours. 09/10/16   Deborha Payment, PA-C  fluticasone (FLONASE) 50  MCG/ACT nasal spray Place 2 sprays into both nostrils daily. 09/10/16   Deborha Payment, PA-C    Family History Family History  Problem Relation Age of Onset  . Hypertension Mother   . Hypertension Father     Social History Social History  Substance Use Topics  . Smoking status: Never Smoker  . Smokeless tobacco: Never Used  . Alcohol use No     Allergies   Patient has no known allergies.   Review of Systems Review of Systems  Constitutional: Negative for chills, fatigue and fever.  HENT: Positive for ear pain and sore throat. Negative for congestion and trouble swallowing.   Eyes: Negative for discharge and itching.  Respiratory: Positive for cough. Negative for shortness of breath and wheezing.   Gastrointestinal: Negative for abdominal pain, nausea and vomiting.  Musculoskeletal: Negative for myalgias and neck pain.  Skin: Negative for rash.  Neurological: Positive for headaches. Negative for syncope.  Hematological: Positive for adenopathy.     Physical Exam Updated Vital Signs BP (!) 104/54 (BP Location: Left Arm)   Pulse 72   Temp 97.9 F (36.6 C) (Oral)   Resp 16   Ht  (1.651 m)   Wt 130 lb 1.1 oz (59 kg)   LMP 08/10/2016   SpO2 100%   BMI 21.64 kg/m   Physical Exam  Constitutional: She appears well-developed and well-nourished. No distress.  HENT:  Head:  Normocephalic and atraumatic.  Right Ear: Tympanic membrane, external ear and ear canal normal.  Left Ear: Tympanic membrane, external ear and ear canal normal.  Nose: Nose normal. No rhinorrhea. Right sinus exhibits no maxillary sinus tenderness and no frontal sinus tenderness. Left sinus exhibits no maxillary sinus tenderness and no frontal sinus tenderness.  Mouth/Throat: Uvula is midline and mucous membranes are normal. No trismus in the jaw. Posterior oropharyngeal erythema present. No oropharyngeal exudate. No tonsillar exudate.  Mild posterior oropharynx erythema.  No tonsillar hypertrophy  or exudate. Uvula rises symmetrically. No swelling of soft palette. Managing oral secretions. No trismus.   Eyes: Conjunctivae are normal. Pupils are equal, round, and reactive to light. Right eye exhibits no discharge. Left eye exhibits no discharge. No scleral icterus.  Neck: Normal range of motion and phonation normal. Neck supple. No neck rigidity. Normal range of motion present.  Cardiovascular: Normal rate, regular rhythm, normal heart sounds and intact distal pulses.  Exam reveals no gallop and no friction rub.   No murmur heard. Pulmonary/Chest: Effort normal and breath sounds normal. No respiratory distress. She has no wheezes. She has no rales.  Abdominal: Soft. There is no tenderness.  Musculoskeletal: Normal range of motion.  Lymphadenopathy:    She has cervical adenopathy (tenderness).  Left cervical lymph node tenderness, shotty swelling.   Neurological: She is alert.  Skin: Skin is warm and dry. She is not diaphoretic.  Psychiatric: She has a normal mood and affect. Her behavior is normal.  Nursing note and vitals reviewed.    ED Treatments / Results  DIAGNOSTIC STUDIES: Oxygen Saturation is 100% on RA, normal by my interpretation.   COORDINATION OF CARE: 12:44 PM- Will order CXR. Pt verbalizes understanding and agrees to plan.  Medications - No data to display  Labs (all labs ordered are listed, but only abnormal results are displayed) Labs Reviewed  RAPID STREP SCREEN (NOT AT [facility])  CULTURE, GROUP A STREP [facility])    EKG  EKG Interpretation None       Radiology Dg Chest 2 View  Result Date: 09/10/2016 CLINICAL DATA:  Cough and chest discomfort. Sore throat and left ear pain. EXAM: CHEST  2 VIEW COMPARISON:  08/08/2016 FINDINGS: The heart size and mediastinal contours are within normal limits. Both lungs are clear. The visualized skeletal structures are unremarkable. IMPRESSION: No active cardiopulmonary disease. Electronically Signed   By: Richarda Overlie M.D.    On: 09/10/2016 13:12    Procedures Procedures (including critical care time)  Medications Ordered in ED Medications - No data to display   Initial Impression / Assessment and Plan / ED Course  I have reviewed the triage vital signs and the nursing notes.  Pertinent labs & imaging results that were available during my care of the patient were reviewed by me and considered in my medical decision making (see chart for details).  Clinical Course as of Sep 11 1339  Sat Sep 10, 2016  1324 DG Chest 2 View [AM]    Clinical Course User Index [AM] Deborha Payment, PA-C    Patient presents to ED with complaint sore throat x 1 day and cough x 3 days. Patient is afebrile and non-toxic appearing in NAD. VSS. Heart RRR. Lungs CTABL. Abdomen soft, non-tender. Mild posterior oropharynx erythema. No tonsillary hypertrophy or exudate. Uvula midline and rises symmetrically. Managing oral secretions. Neck ROM intact. Low suspicion for PTA or ludwig's angina at this time. Rapid strep negative, will send for culture. CXR normal, no  evidence of pneumonia. Patients symptoms are consistent with URI, likely viral etiology. Discussed that antibiotics are not indicated for viral infections. Pt will be discharged with symptomatic treatment.  Return precautions given. Verbalizes understanding and is agreeable with plan. Pt is hemodynamically stable & in NAD prior to dc.   I personally performed the services described in this documentation, which was scribed in my presence. The recorded information has been reviewed and is accurate.   Final Clinical Impressions(s) / ED Diagnoses   Final diagnoses:  Viral URI with cough    New Prescriptions New Prescriptions   BENZONATATE (TESSALON) 100 MG CAPSULE    Take 1 capsule (100 mg total) by mouth every 8 (eight) hours.   FLUTICASONE (FLONASE) 50 MCG/ACT NASAL SPRAY    Place 2 sprays into both nostrils daily.     Deborha Payment, PA-C 09/10/16 1341    Maia Plan, MD 09/11/16 [phone]

## 2016-09-10 NOTE — ED Triage Notes (Signed)
Pt reports onset last night of dry cough, left side sore throat and left ear pain. No acute distress is noted at triage.

## 2016-09-12 LAB — CULTURE, GROUP A STREP (THRC)

## 2016-10-11 NOTE — Progress Notes (Deleted)
   Cardiology Office Note    Date:  10/11/2016   ID:  Erin Newman, DOB [birth year], MRN [id number]  PCP:  Caren Griffins, CNM  Cardiologist:  Dr. Anne Fu  CC: follow up of palpitations   History of Present Illness:  Erin Newman is a 21 y.o. female with a history of  vit B deficinecy who presents to clinic for follow up of palpitations.   She was seen in the [facility] ED on 08/08/16 for evaluation of palpitations. Lab work inclucing CBC and BMET were normal. She was referred for outpatient cardiology follow up.   I saw her for evaluation on 08/24/16. She complained of heart skipping, stops and racing. I ordered a 30 day monitor but patient refused as she is self pay and she was worried that a 30 day monitor would not catch palpitations.   Today she presents to clinic for follow up   Past Medical History:  Diagnosis Date  . B12 deficiency   . Medical history non-contributory   . Miscarriage   . Rapid heart beat     Past Surgical History:  Procedure Laterality Date  . NO PAST SURGERIES      Current Medications: Outpatient Medications Prior to Visit  Medication Sig Dispense Refill  . benzonatate (TESSALON) 100 MG capsule Take 1 capsule (100 mg total) by mouth every 8 (eight) hours. 21 capsule 0  . fluticasone (FLONASE) 50 MCG/ACT nasal spray Place 2 sprays into both nostrils daily. 16 g 2   No facility-administered medications prior to visit.      Allergies:   Patient has no known allergies.   Social History   Social History  . Marital status: Married    Spouse name: N/A  . Number of children: N/A  . Years of education: N/A   Social History Main Topics  . Smoking status: Never Smoker  . Smokeless tobacco: Never Used  . Alcohol use No  . Drug use: No  . Sexual activity: Yes    Birth control/ protection: None   Other Topics Concern  . Not on file   Social History Narrative  . No narrative on file     Family History:  The patient's ***family history includes  Hypertension in her father and mother.      *** ROS/PE    Wt Readings from Last 3 Encounters:  09/10/16 130 lb 1.1 oz (59 kg)  08/24/16 134 lb (60.8 kg)  03/13/16 130 lb 1.1 oz (59 kg)      Studies/Labs Reviewed:   EKG:  EKG is*** ordered today.  The ekg ordered today demonstrates ***  Recent Labs: 08/08/2016: BUN 12; Creatinine, Ser 0.58; Hemoglobin 13.0; Platelets 206; Potassium 3.7; Sodium 138 08/24/2016: TSH 1.080   Lipid Panel No results found for: CHOL, TRIG, HDL, CHOLHDL, VLDL, LDLCALC, LDLDIRECT  Additional studies/ records that were reviewed today include:  CBC and BMET from ER 08/08/16- personally reviewed   ASSESSMENT & PLAN:   Palpitations:    Medication Adjustments/Labs and Tests Ordered: Current medicines are reviewed at length with the patient today.  Concerns regarding medicines are outlined above.  Medication changes, Labs and Tests ordered today are listed in the Patient Instructions below. There are no Patient Instructions on file for this visit.   Signed, Cline Crock, PA-C  10/11/2016 8:27 PM    [facility] [address] [place], [place], [place]  [address] Phone: [phone]; Fax: [phone]

## 2016-10-12 ENCOUNTER — Ambulatory Visit: Payer: Self-pay | Admitting: Physician Assistant

## 2016-10-19 ENCOUNTER — Encounter: Payer: Self-pay | Admitting: Physician Assistant

## 2017-01-12 NOTE — Telephone Encounter (Signed)
See note

## 2017-06-01 ENCOUNTER — Emergency Department (HOSPITAL_COMMUNITY)
Admission: EM | Admit: 2017-06-01 | Discharge: 2017-06-02 | Disposition: A | Discharge status: Home / Self Care | Payer: Self-pay | Attending: Emergency Medicine | Admitting: Emergency Medicine

## 2017-06-01 ENCOUNTER — Emergency Department (HOSPITAL_COMMUNITY): Payer: Self-pay

## 2017-06-01 ENCOUNTER — Encounter (HOSPITAL_COMMUNITY): Payer: Self-pay

## 2017-06-01 ENCOUNTER — Other Ambulatory Visit: Payer: Self-pay

## 2017-06-01 DIAGNOSIS — J020 Streptococcal pharyngitis: Secondary | ICD-10-CM | POA: Insufficient documentation

## 2017-06-01 LAB — COMPREHENSIVE METABOLIC PANEL
ALT: 33 U/L (ref 14–54)
AST: 40 U/L (ref 15–41)
Albumin: 4 g/dL (ref 3.5–5.0)
Alkaline Phosphatase: 62 U/L (ref 38–126)
Anion gap: 10 (ref 5–15)
BILIRUBIN TOTAL: 0.8 mg/dL (ref 0.3–1.2)
BUN: 12 mg/dL (ref 6–20)
CALCIUM: 8.6 mg/dL — AB (ref 8.9–10.3)
CHLORIDE: 105 mmol/L (ref 101–111)
CO2: 18 mmol/L — ABNORMAL LOW (ref 22–32)
CREATININE: 0.66 mg/dL (ref 0.44–1.00)
Glucose, Bld: 116 mg/dL — ABNORMAL HIGH (ref 65–99)
Potassium: 3.2 mmol/L — ABNORMAL LOW (ref 3.5–5.1)
Sodium: 133 mmol/L — ABNORMAL LOW (ref 135–145)
TOTAL PROTEIN: 7.5 g/dL (ref 6.5–8.1)

## 2017-06-01 LAB — CBC WITH DIFFERENTIAL/PLATELET
BASOS ABS: 0 10*3/uL (ref 0.0–0.1)
BASOS PCT: 0 %
EOS ABS: 0 10*3/uL (ref 0.0–0.7)
EOS PCT: 0 %
HCT: 37.1 % (ref 36.0–46.0)
Hemoglobin: 12.7 g/dL (ref 12.0–15.0)
LYMPHS PCT: 8 %
Lymphs Abs: 0.8 10*3/uL (ref 0.7–4.0)
MCH: 27.5 pg (ref 26.0–34.0)
MCHC: 34.2 g/dL (ref 30.0–36.0)
MCV: 80.5 fL (ref 78.0–100.0)
Monocytes Absolute: 0.6 10*3/uL (ref 0.1–1.0)
Monocytes Relative: 6 %
Neutro Abs: 8.6 10*3/uL — ABNORMAL HIGH (ref 1.7–7.7)
Neutrophils Relative %: 86 %
PLATELETS: 170 10*3/uL (ref 150–400)
RBC: 4.61 MIL/uL (ref 3.87–5.11)
RDW: 13.8 % (ref 11.5–15.5)
WBC: 10 10*3/uL (ref 4.0–10.5)

## 2017-06-01 LAB — I-STAT BETA HCG BLOOD, ED (MC, WL, AP ONLY)

## 2017-06-01 LAB — I-STAT CG4 LACTIC ACID, ED: LACTIC ACID, VENOUS: 1.01 mmol/L (ref 0.5–1.9)

## 2017-06-01 LAB — PROTIME-INR
INR: 1.17
PROTHROMBIN TIME: 14.8 s (ref 11.4–15.2)

## 2017-06-01 LAB — RAPID STREP SCREEN (MED CTR MEBANE ONLY): Streptococcus, Group A Screen (Direct): POSITIVE — AB

## 2017-06-01 MED ORDER — ACETAMINOPHEN 325 MG PO TABS
650.0000 mg | ORAL_TABLET | Freq: Once | ORAL | Status: AC
  Administered 2017-06-01: 650 mg via ORAL

## 2017-06-01 MED ORDER — PENICILLIN G BENZATHINE 1200000 UNIT/2ML IM SUSP
1.2000 10*6.[IU] | Freq: Once | INTRAMUSCULAR | Status: AC
  Administered 2017-06-01: 1.2 10*6.[IU] via INTRAMUSCULAR
  Filled 2017-06-01: qty 2

## 2017-06-01 MED ORDER — SODIUM CHLORIDE 0.9 % IV BOLUS (SEPSIS)
1000.0000 mL | Freq: Once | INTRAVENOUS | Status: AC
  Administered 2017-06-01: 1000 mL via INTRAVENOUS

## 2017-06-01 MED ORDER — IBUPROFEN 800 MG PO TABS
800.0000 mg | ORAL_TABLET | Freq: Once | ORAL | Status: AC
  Administered 2017-06-01: 800 mg via ORAL
  Filled 2017-06-01: qty 1

## 2017-06-01 MED ORDER — SODIUM CHLORIDE 0.9 % IV BOLUS (SEPSIS)
1000.0000 mL | Freq: Once | INTRAVENOUS | Status: AC
Start: 2017-06-01 — End: 2017-06-01
  Administered 2017-06-01: 1000 mL via INTRAVENOUS

## 2017-06-01 MED ORDER — KETOROLAC TROMETHAMINE 30 MG/ML IJ SOLN
30.0000 mg | Freq: Once | INTRAMUSCULAR | Status: AC
  Administered 2017-06-01: 30 mg via INTRAVENOUS
  Filled 2017-06-01: qty 1

## 2017-06-01 NOTE — ED Provider Notes (Signed)
[facility] EMERGENCY DEPARTMENT Provider Note   CSN: [id number] Arrival date & time: 06/01/17  1959     History   Chief Complaint Chief Complaint  Patient presents with  . Nausea  . Weakness  . Headache    HPI Erin Newman is a 21 y.o. female.  Patient presents to the ED with a chief complaint of fever.  She reports that her symptoms started this morning.  She reports associated headache, sore throat, fatigue, and nausea.  She has not taken anything for her symptoms.  She reports that her 26 year old child has also been sick.  She denies having taken anything for her symptoms.  There are no modifying factors.  She denies any recent travel.  She denies any other associated symptoms.   The history is provided by the patient. No language interpreter was used.    Past Medical History:  Diagnosis Date  . B12 deficiency   . Medical history non-contributory   . Miscarriage   . Rapid heart beat     Patient Active Problem List   Diagnosis Date Noted  . Palpitation 08/23/2016  . B12 deficiency 04/08/2013    Past Surgical History:  Procedure Laterality Date  . NO PAST SURGERIES      OB History    Gravida Para Term Preterm AB Living   3 2 2  0 1 2   SAB TAB Ectopic Multiple Live Births   1 0 0 0 2       Home Medications    Prior to Admission medications   Medication Sig Start Date End Date Taking? Authorizing Provider  benzonatate (TESSALON) 100 MG capsule Take 1 capsule (100 mg total) by mouth every 8 (eight) hours. Patient not taking: Reported on 06/01/2017 09/10/16   Arvilla Meres L, PA-C  fluticasone [facility]) 50 MCG/ACT nasal spray Place 2 sprays into both nostrils daily. Patient not taking: Reported on 06/01/2017 09/10/16   Deborha Payment, PA-C    Family History Family History  Problem Relation Age of Onset  . Hypertension Mother   . Hypertension Father     Social History Social History   Tobacco Use  . Smoking status: Never  Smoker  . Smokeless tobacco: Never Used  Substance Use Topics  . Alcohol use: No  . Drug use: No     Allergies   Patient has no known allergies.   Review of Systems Review of Systems  All other systems reviewed and are negative.    Physical Exam Updated Vital Signs BP (!) 106/53   Pulse 99   Temp (!) 102.9 F (39.4 C) (Oral)   Resp 18   Ht 5\' 5"  (1.651 m)   Wt 57 kg (125 lb 10.6 oz)   LMP 04/24/2017 (Exact Date)   SpO2 100%   BMI 20.91 kg/m   Physical Exam  Constitutional: She is oriented to person, place, and time. She appears well-developed and well-nourished.  HENT:  Head: Normocephalic and atraumatic.  Erythematous, no tonsillar exudates, no abscess, uvula is midline, airway intact, no stridor  Eyes: Conjunctivae and EOM are normal. Pupils are equal, round, and reactive to light.  Neck: Normal range of motion. Neck supple.  Full ROM  Cardiovascular: Regular rhythm. Exam reveals no gallop and no friction rub.  No murmur heard. tachycardic  Pulmonary/Chest: Effort normal and breath sounds normal. No respiratory distress. She has no wheezes. She has no rales. She exhibits no tenderness.  Abdominal: Soft. Bowel sounds are normal. She  exhibits no distension and no mass. There is no tenderness. There is no rebound and no guarding.  Musculoskeletal: Normal range of motion. She exhibits no edema or tenderness.  Neurological: She is alert and oriented to person, place, and time.  Skin: Skin is warm and dry.  No rash  Psychiatric: She has a normal mood and affect. Her behavior is normal. Judgment and thought content normal.  Nursing note and vitals reviewed.    ED Treatments / Results  Labs (all labs ordered are listed, but only abnormal results are displayed) Labs Reviewed  RAPID STREP SCREEN ([facility]) - Abnormal; Notable for the following components:      Result Value   Streptococcus, Group A Screen (Direct) POSITIVE (*)    All other components within  normal limits  COMPREHENSIVE METABOLIC PANEL - Abnormal; Notable for the following components:   Sodium 133 (*)    Potassium 3.2 (*)    CO2 18 (*)    Glucose, Bld 116 (*)    Calcium 8.6 (*)    All other components within normal limits  CBC WITH DIFFERENTIAL/PLATELET - Abnormal; Notable for the following components:   Neutro Abs 8.6 (*)    All other components within normal limits  CULTURE, BLOOD (ROUTINE X 2)  CULTURE, BLOOD (ROUTINE X 2)  PROTIME-INR  URINALYSIS, ROUTINE W REFLEX MICROSCOPIC  I-STAT CG4 LACTIC ACID, ED  I-STAT BETA HCG BLOOD, ED ([facility], [facility], [facility] ONLY)  I-STAT CG4 LACTIC ACID, ED    EKG  EKG Interpretation None       Radiology Dg Chest Portable 1 View  Result Date: 06/01/2017 CLINICAL DATA:  Sepsis.  Weakness and nausea.  Fever. EXAM: PORTABLE CHEST 1 VIEW COMPARISON:  Radiographs 09/10/2016 FINDINGS: Low lung volumes. The cardiomediastinal contours are normal. The lungs are clear. Pulmonary vasculature is normal. No consolidation, pleural effusion, or pneumothorax. No acute osseous abnormalities are seen. IMPRESSION: Low lung volumes without acute abnormality. Electronically Signed   By: Rubye OaksMelanie  Ehinger M.D.   On: 06/01/2017 22:11    Procedures Procedures (including critical care time)  Medications Ordered in ED Medications  ketorolac (TORADOL) 30 MG/ML injection 30 mg (not administered)  sodium chloride 0.9 % bolus 1,000 mL (not administered)  sodium chloride 0.9 % bolus 1,000 mL (not administered)  penicillin g benzathine (BICILLIN LA) 1200000 UNIT/2ML injection 1.2 Million Units (not administered)  acetaminophen (TYLENOL) tablet 650 mg (650 mg Oral Given 06/01/17 2023)  ibuprofen (ADVIL,MOTRIN) tablet 800 mg (800 mg Oral Given 06/01/17 2207)     Initial Impression / Assessment and Plan / ED Course  I have reviewed the triage vital signs and the nursing notes.  Pertinent labs & imaging results that were available during my care of the patient were  reviewed by me and considered in my medical decision making (see chart for details).     Patient with fever, headache, sore throat, fatigue and nausea.  Febrile to 103 in triage.  Strep test is positive.  CXR negative.  Lactate is normal.  No leukocytosis.  Will give fluids, antipyretics, and penicillin for strep.  Will reassess.  12:01 AM States that she feels much much better.  States her symptoms have almost completely resolved.  Looks well.  DC to home.  Final Clinical Impressions(s) / ED Diagnoses   Final diagnoses:  Strep throat    ED Discharge Orders        Ordered    amoxicillin (AMOXIL) 500 MG capsule  3 times daily  06/02/17 0000       Roxy HorsemanBrowning, Berna Gitto, PA-C 06/02/17 0002    Mesner, Barbara CowerJason, MD 06/02/17 [id number]

## 2017-06-01 NOTE — ED Notes (Signed)
ED Provider at bedside. 

## 2017-06-01 NOTE — ED Triage Notes (Addendum)
Pt endorses headache, weakness, and nausea since 0800 this morning. No hx of migraines. Axox4. Pt states "I am so weak I cannot do anything" Tachycardic 130. BP 94/57 and oral temp 103. Denies cough or shob.

## 2017-06-02 MED ORDER — AMOXICILLIN 500 MG PO CAPS: 500.0000 mg | ORAL_CAPSULE | Freq: Three times a day (TID) | ORAL | 0 refills | Status: DC

## 2017-06-02 NOTE — Discharge Instructions (Signed)
You have STREP THROAT.  You must take the antibiotic as directed.  Take Tylenol every 6 hours for fever.  You may also take Ibuprofen every 6 hours for fever.

## 2017-06-06 LAB — CULTURE, BLOOD (ROUTINE X 2)
CULTURE: NO GROWTH
Culture: NO GROWTH
Special Requests: ADEQUATE
Special Requests: ADEQUATE

## 2017-10-14 ENCOUNTER — Encounter (HOSPITAL_COMMUNITY): Payer: Self-pay | Admitting: Emergency Medicine

## 2017-10-14 ENCOUNTER — Other Ambulatory Visit: Payer: Self-pay

## 2017-10-14 ENCOUNTER — Emergency Department (HOSPITAL_COMMUNITY)
Admission: EM | Admit: 2017-10-14 | Discharge: 2017-10-15 | Disposition: A | Discharge status: Home / Self Care | Payer: Self-pay | Attending: Emergency Medicine | Admitting: Emergency Medicine

## 2017-10-14 ENCOUNTER — Emergency Department (HOSPITAL_COMMUNITY): Payer: Self-pay

## 2017-10-14 DIAGNOSIS — J020 Streptococcal pharyngitis: Secondary | ICD-10-CM | POA: Insufficient documentation

## 2017-10-14 LAB — I-STAT BETA HCG BLOOD, ED (MC, WL, AP ONLY)

## 2017-10-14 LAB — COMPREHENSIVE METABOLIC PANEL
ALT: 18 U/L (ref 14–54)
AST: 20 U/L (ref 15–41)
Albumin: 3.9 g/dL (ref 3.5–5.0)
Alkaline Phosphatase: 55 U/L (ref 38–126)
Anion gap: 11 (ref 5–15)
BUN: 12 mg/dL (ref 6–20)
CALCIUM: 8.9 mg/dL (ref 8.9–10.3)
CHLORIDE: 104 mmol/L (ref 101–111)
CO2: 20 mmol/L — ABNORMAL LOW (ref 22–32)
Creatinine, Ser: 0.74 mg/dL (ref 0.44–1.00)
Glucose, Bld: 105 mg/dL — ABNORMAL HIGH (ref 65–99)
Potassium: 3.4 mmol/L — ABNORMAL LOW (ref 3.5–5.1)
Sodium: 135 mmol/L (ref 135–145)
Total Bilirubin: 0.9 mg/dL (ref 0.3–1.2)
Total Protein: 7 g/dL (ref 6.5–8.1)

## 2017-10-14 LAB — CBC WITH DIFFERENTIAL/PLATELET
BASOS ABS: 0 10*3/uL (ref 0.0–0.1)
BASOS PCT: 0 %
EOS ABS: 0 10*3/uL (ref 0.0–0.7)
Eosinophils Relative: 0 %
HCT: 36.1 % (ref 36.0–46.0)
HEMOGLOBIN: 12.1 g/dL (ref 12.0–15.0)
Lymphocytes Relative: 7 %
Lymphs Abs: 0.9 10*3/uL (ref 0.7–4.0)
MCH: 27.2 pg (ref 26.0–34.0)
MCHC: 33.5 g/dL (ref 30.0–36.0)
MCV: 81.1 fL (ref 78.0–100.0)
Monocytes Absolute: 0.4 10*3/uL (ref 0.1–1.0)
Monocytes Relative: 3 %
NEUTROS PCT: 90 %
Neutro Abs: 11 10*3/uL — ABNORMAL HIGH (ref 1.7–7.7)
Platelets: 173 10*3/uL (ref 150–400)
RBC: 4.45 MIL/uL (ref 3.87–5.11)
RDW: 14.1 % (ref 11.5–15.5)
WBC: 12.3 10*3/uL — AB (ref 4.0–10.5)

## 2017-10-14 LAB — I-STAT CG4 LACTIC ACID, ED: Lactic Acid, Venous: 0.66 mmol/L (ref 0.5–1.9)

## 2017-10-14 MED ORDER — ACETAMINOPHEN 325 MG PO TABS
650.0000 mg | ORAL_TABLET | Freq: Once | ORAL | Status: AC
  Administered 2017-10-14: 650 mg via ORAL
  Filled 2017-10-14: qty 2

## 2017-10-14 MED ORDER — IBUPROFEN 400 MG PO TABS
400.0000 mg | ORAL_TABLET | Freq: Once | ORAL | Status: AC
  Administered 2017-10-14: 400 mg via ORAL
  Filled 2017-10-14: qty 1

## 2017-10-14 NOTE — ED Triage Notes (Signed)
Pt presents to ED for assessment of generalized body aches, sore throat, ear ache, fevers (103 at triage), nausea, 2 episodes of emesis, denies diarrhea, denies changes in urination, complains of generalized abdominal pain.

## 2017-10-15 LAB — URINALYSIS, ROUTINE W REFLEX MICROSCOPIC
BILIRUBIN URINE: NEGATIVE
GLUCOSE, UA: NEGATIVE mg/dL
Hgb urine dipstick: NEGATIVE
Ketones, ur: 20 mg/dL — AB
NITRITE: NEGATIVE
PH: 5 (ref 5.0–8.0)
Protein, ur: NEGATIVE mg/dL
Specific Gravity, Urine: 1.028 (ref 1.005–1.030)

## 2017-10-15 LAB — GROUP A STREP BY PCR: Group A Strep by PCR: DETECTED — AB

## 2017-10-15 LAB — URINALYSIS, MICROSCOPIC (REFLEX)

## 2017-10-15 MED ORDER — IBUPROFEN 600 MG PO TABS: 600.0000 mg | ORAL_TABLET | Freq: Four times a day (QID) | ORAL | 0 refills | Status: AC | PRN

## 2017-10-15 MED ORDER — PENICILLIN G BENZATHINE 1200000 UNIT/2ML IM SUSP
1.2000 10*6.[IU] | Freq: Once | INTRAMUSCULAR | Status: AC
  Administered 2017-10-15: 1.2 10*6.[IU] via INTRAMUSCULAR
  Filled 2017-10-15: qty 2

## 2017-10-15 NOTE — ED Provider Notes (Signed)
[facility] EMERGENCY DEPARTMENT Provider Note   CSN: [id number] Arrival date & time: 10/14/17  1910     History   Chief Complaint Chief Complaint  Patient presents with  . Fever  . Bodyaches    HPI Erin Newman is a 22 y.o. female.  Patient here for evaluation of symptoms that started this morning (10/14/17) of sore throat, body aches, fever, nausea w/o vomiting and headache. No cough or significant congestion. She had a recent episode of strep throat and feels this is the same.   The history is provided by the patient. No language interpreter was used.  Fever   Associated symptoms include headaches and sore throat. Pertinent negatives include no vomiting, no congestion and no cough.    Past Medical History:  Diagnosis Date  . B12 deficiency   . Medical history non-contributory   . Miscarriage   . Rapid heart beat     Patient Active Problem List   Diagnosis Date Noted  . Palpitation 08/23/2016  . B12 deficiency 04/08/2013    Past Surgical History:  Procedure Laterality Date  . NO PAST SURGERIES       OB History    Gravida  3   Para  2   Term  2   Preterm  0   AB  1   Living  2     SAB  1   TAB  0   Ectopic  0   Multiple  0   Live Births  2            Home Medications    Prior to Admission medications   Medication Sig Start Date End Date Taking? Authorizing Provider  amoxicillin (AMOXIL) 500 MG capsule Take 1 capsule (500 mg total) by mouth 3 (three) times daily. Patient not taking: Reported on 10/14/2017 06/02/17   Roxy Horseman, PA-C  benzonatate (TESSALON) 100 MG capsule Take 1 capsule (100 mg total) by mouth every 8 (eight) hours. Patient not taking: Reported on 06/01/2017 09/10/16   Arvilla Meres L, PA-C  fluticasone [facility]) 50 MCG/ACT nasal spray Place 2 sprays into both nostrils daily. Patient not taking: Reported on 06/01/2017 09/10/16   Deborha Payment, PA-C    Family History Family History  Problem  Relation Age of Onset  . Hypertension Mother   . Hypertension Father     Social History Social History   Tobacco Use  . Smoking status: Never Smoker  . Smokeless tobacco: Never Used  Substance Use Topics  . Alcohol use: No  . Drug use: No     Allergies   Patient has no known allergies.   Review of Systems Review of Systems  Constitutional: Positive for appetite change and fever.  HENT: Positive for sore throat. Negative for congestion and trouble swallowing.   Respiratory: Negative.  Negative for cough.   Cardiovascular: Negative.   Gastrointestinal: Positive for nausea. Negative for vomiting.  Musculoskeletal: Positive for myalgias.  Skin: Negative.   Neurological: Positive for headaches.     Physical Exam Updated Vital Signs BP 105/64   Pulse (!) 112   Temp 98.6 F (37 C) (Oral)   Resp 14   SpO2 96%   Physical Exam  Constitutional: She is oriented to person, place, and time. She appears well-developed and well-nourished.  HENT:  Head: Normocephalic.  Tonsillar swelling and exudates bilaterally. Uvula midline. No visualized abscess.   Neck: Normal range of motion. Neck supple.  Cardiovascular: Normal rate and regular rhythm.  Pulmonary/Chest: Effort normal and breath sounds normal. She has no wheezes. She has no rales.  Abdominal: Soft. Bowel sounds are normal. There is no tenderness. There is no rebound and no guarding.  Musculoskeletal: Normal range of motion.  Lymphadenopathy:    She has cervical adenopathy.  Neurological: She is alert and oriented to person, place, and time.  Skin: Skin is warm and dry. No rash noted.  Psychiatric: She has a normal mood and affect.     ED Treatments / Results  Labs (all labs ordered are listed, but only abnormal results are displayed) Labs Reviewed  COMPREHENSIVE METABOLIC PANEL - Abnormal; Notable for the following components:      Result Value   Potassium 3.4 (*)    CO2 20 (*)    Glucose, Bld 105 (*)    All  other components within normal limits  CBC WITH DIFFERENTIAL/PLATELET - Abnormal; Notable for the following components:   WBC 12.3 (*)    Neutro Abs 11.0 (*)    All other components within normal limits  URINALYSIS, ROUTINE W REFLEX MICROSCOPIC - Abnormal; Notable for the following components:   APPearance CLOUDY (*)    Ketones, ur 20 (*)    Leukocytes, UA MODERATE (*)    All other components within normal limits  GROUP A STREP BY PCR  INFLUENZA PANEL BY PCR (TYPE A & B)  URINALYSIS, MICROSCOPIC (REFLEX)  I-STAT CG4 LACTIC ACID, ED  I-STAT BETA HCG BLOOD, ED ([facility], [facility], [facility] ONLY)  I-STAT CG4 LACTIC ACID, ED    EKG None  Radiology Dg Chest 2 View  Result Date: 10/14/2017 CLINICAL DATA:  Generalized body aches and sore throat with fever. EXAM: CHEST - 2 VIEW COMPARISON:  Chest x-ray dated June 01, 2017. FINDINGS: The heart size and mediastinal contours are within normal limits. Both lungs are clear. The visualized skeletal structures are unremarkable. IMPRESSION: No active cardiopulmonary disease. Electronically Signed   By: Obie Dredge M.D.   On: 10/14/2017 20:04    Procedures Procedures (including critical care time)  Medications Ordered in ED Medications  acetaminophen (TYLENOL) tablet 650 mg (650 mg Oral Given 10/14/17 1950)  ibuprofen (ADVIL,MOTRIN) tablet 400 mg (400 mg Oral Given 10/14/17 1950)     Initial Impression / Assessment and Plan / ED Course  I have reviewed the triage vital signs and the nursing notes.  Pertinent labs & imaging results that were available during my care of the patient were reviewed by me and considered in my medical decision making (see chart for details).     Patient here with ST, N w/o V, headache, body aches and fever x 1 day, similar to previous recent strep throat.   Strep is positive tonight, c/w presentation and exam. IM Bicillin discussed with the patient and ordered.   Will refer to ENT as this is second infection in one month.     Final Clinical Impressions(s) / ED Diagnoses   Final diagnoses:  None   1. Strep throat  ED Discharge Orders    None       Danne Harbor 10/15/17 0133    Rolland Porter, MD 10/15/17 [id number]

## 2017-10-16 ENCOUNTER — Telehealth: Payer: Self-pay | Admitting: *Deleted

## 2017-10-16 NOTE — Telephone Encounter (Signed)
Post ED Visit - Positive Culture Follow-up  Culture report reviewed by antimicrobial stewardship pharmacist:   Enzo Bi, Pharm.D.  Celedonio Miyamoto, Pharm.D., BCPS AQ-ID  Garvin Fila, Pharm.D., BCPS  Georgina Pillion, [address].D., BCPS  [place], [address].D., BCPS, AAHIVP  Estella Husk, Pharm.D., BCPS, AAHIVP  Lysle Pearl, PharmD, BCPS  Sherlynn Carbon, PharmD  Pollyann Samples, PharmD, BCPS Sharin Mons, PharmD  Positive strep culture Treated with Bicillin LA given in the ED, organism sensitive to the same and no further patient follow-up is required at this time.  Virl Axe [facility] 10/16/2017, 10:37 AM

## 2018-03-01 ENCOUNTER — Inpatient Hospital Stay (HOSPITAL_COMMUNITY)
Admission: AD | Admit: 2018-03-01 | Discharge: 2018-03-02 | Disposition: A | Discharge status: Home / Self Care | Payer: Self-pay | Source: Ambulatory Visit | Attending: Obstetrics & Gynecology | Admitting: Obstetrics & Gynecology

## 2018-03-01 ENCOUNTER — Encounter (HOSPITAL_COMMUNITY): Payer: Self-pay | Admitting: *Deleted

## 2018-03-01 DIAGNOSIS — O021 Missed abortion: Secondary | ICD-10-CM | POA: Insufficient documentation

## 2018-03-01 DIAGNOSIS — Z3A08 8 weeks gestation of pregnancy: Secondary | ICD-10-CM | POA: Insufficient documentation

## 2018-03-01 DIAGNOSIS — O209 Hemorrhage in early pregnancy, unspecified: Secondary | ICD-10-CM

## 2018-03-01 LAB — URINALYSIS, ROUTINE W REFLEX MICROSCOPIC
Bilirubin Urine: NEGATIVE
GLUCOSE, UA: NEGATIVE mg/dL
HGB URINE DIPSTICK: NEGATIVE
KETONES UR: NEGATIVE mg/dL
LEUKOCYTES UA: NEGATIVE
Nitrite: NEGATIVE
PH: 6 (ref 5.0–8.0)
Protein, ur: NEGATIVE mg/dL
Specific Gravity, Urine: 1.015 (ref 1.005–1.030)

## 2018-03-01 LAB — POCT PREGNANCY, URINE: Preg Test, Ur: POSITIVE — AB

## 2018-03-01 NOTE — MAU Note (Signed)
PT SAYS  SHE HAS VAG D/C - WITH DARK RED.   HAS BACK PAIN AND  H/A.   FEELS LIKE FEET ARE HEAVY.   HAS AN APPOINTMENT  AT [facility]- [facility].   FOR PAPERWORK

## 2018-03-02 ENCOUNTER — Inpatient Hospital Stay (HOSPITAL_COMMUNITY): Payer: Self-pay

## 2018-03-02 DIAGNOSIS — O021 Missed abortion: Secondary | ICD-10-CM

## 2018-03-02 DIAGNOSIS — O209 Hemorrhage in early pregnancy, unspecified: Secondary | ICD-10-CM

## 2018-03-02 LAB — CBC
HCT: 34.3 % — ABNORMAL LOW (ref 36.0–46.0)
Hemoglobin: 11.9 g/dL — ABNORMAL LOW (ref 12.0–15.0)
MCH: 28.8 pg (ref 26.0–34.0)
MCHC: 34.7 g/dL (ref 30.0–36.0)
MCV: 83.1 fL (ref 78.0–100.0)
Platelets: 167 10*3/uL (ref 150–400)
RBC: 4.13 MIL/uL (ref 3.87–5.11)
RDW: 14.2 % (ref 11.5–15.5)
WBC: 7.2 10*3/uL (ref 4.0–10.5)

## 2018-03-02 LAB — HCG, QUANTITATIVE, PREGNANCY: HCG, BETA CHAIN, QUANT, S: 27457 m[IU]/mL — AB (ref <5)

## 2018-03-02 MED ORDER — HYDROCODONE-ACETAMINOPHEN 5-325 MG PO TABS: 1.0000 | ORAL_TABLET | ORAL | 0 refills | Status: AC | PRN

## 2018-03-02 MED ORDER — MISOPROSTOL 200 MCG PO TABS: 800.0000 ug | ORAL_TABLET | Freq: Once | ORAL | 1 refills | Status: AC

## 2018-03-02 NOTE — MAU Note (Cosign Needed)
None     Chief Complaint:  Back Pain   Erin Newman is  22 y.o. [id number] at Unknown presents complaining of Back Pain .  She thinks she may be 14 weeks by LMP.  Noticed some brown discharge when wiping X1, none now.  Has a HA and some LBP, hasna't taken anything for it. Plans to go to HD, has appt 10/7  Obstetrical/Gynecological History: OB History    Gravida  3   Para  2   Term  2   Preterm  0   AB  1   Living  2     SAB  1   TAB  0   Ectopic  0   Multiple  0   Live Births  2          Past Medical History: Past Medical History:  Diagnosis Date  . B12 deficiency   . Medical history non-contributory   . Miscarriage   . Rapid heart beat     Past Surgical History: Past Surgical History:  Procedure Laterality Date  . NO PAST SURGERIES      Family History: Family History  Problem Relation Age of Onset  . Hypertension Mother   . Hypertension Father     Social History: Social History   Tobacco Use  . Smoking status: Never Smoker  . Smokeless tobacco: Never Used  Substance Use Topics  . Alcohol use: No  . Drug use: No    Allergies: No Known Allergies  Meds:  Medications Prior to Admission  Medication Sig Dispense Refill Last Dose  . amoxicillin (AMOXIL) 500 MG capsule Take 1 capsule (500 mg total) by mouth 3 (three) times daily. (Patient not taking: Reported on 10/14/2017) 21 capsule 0 Completed Course at Unknown time  . benzonatate (TESSALON) 100 MG capsule Take 1 capsule (100 mg total) by mouth every 8 (eight) hours. (Patient not taking: Reported on 06/01/2017) 21 capsule 0 Completed Course at Unknown time  . fluticasone (FLONASE) 50 MCG/ACT nasal spray Place 2 sprays into both nostrils daily. (Patient not taking: Reported on 06/01/2017) 16 g 2 Completed Course at Unknown time  . ibuprofen (ADVIL,MOTRIN) 600 MG tablet Take 1 tablet (600 mg total) by mouth every 6 (six) hours as needed. 30 tablet 0     Review of Systems   Constitutional:  Negative for fever and chills Eyes: Negative for visual disturbances Respiratory: Negative for shortness of breath, dyspnea Cardiovascular: Negative for chest pain or palpitations  Gastrointestinal: Negative for vomiting, diarrhea and constipation Genitourinary: Negative for dysuria and urgency Musculoskeletal: Negative for  joint pain, myalgias.  Normal ROM  Neurological: Negative for dizziness     Physical Exam  Blood pressure (!) 118/57, pulse 74, temperature 98.4 F (36.9 C), temperature source Oral, resp. rate 20, height 5\' 5"  (1.651 m), weight 59.2 kg, last menstrual period 12/17/2017, currently breastfeeding. GENERAL: Well-developed, well-nourished female in no acute distress.  LUNGS: Clear to auscultation bilaterally.  HEART: Regular rate and rhythm. ABDOMEN: Soft, nontender, nondistended, gravid.  EXTREMITIES: Nontender, no edema, 2+ distal pulses. DTR's 2+ FHR: couldn't auscultate w/doppler    Labs: Results for orders placed or performed during the hospital encounter of 03/01/18 (from the past 24 hour(s))  Urinalysis, Routine w reflex microscopic   Collection Time: 03/01/18 10:21 PM  Result Value Ref Range   Color, Urine YELLOW YELLOW   APPearance CLEAR CLEAR   Specific Gravity, Urine 1.015 1.005 - 1.030   pH 6.0 5.0 - 8.0   Glucose,  UA NEGATIVE NEGATIVE mg/dL   Hgb urine dipstick NEGATIVE NEGATIVE   Bilirubin Urine NEGATIVE NEGATIVE   Ketones, ur NEGATIVE NEGATIVE mg/dL   Protein, ur NEGATIVE NEGATIVE mg/dL   Nitrite NEGATIVE NEGATIVE   Leukocytes, UA NEGATIVE NEGATIVE  Pregnancy, urine POC   Collection Time: 03/01/18 10:24 PM  Result Value Ref Range   Preg Test, Ur POSITIVE (A) NEGATIVE  CBC   Collection Time: 03/02/18 12:24 AM  Result Value Ref Range   WBC 7.2 4.0 - 10.5 K/uL   RBC 4.13 3.87 - 5.11 MIL/uL   Hemoglobin 11.9 (L) 12.0 - 15.0 g/dL   HCT 16.1 (L) 09.6 - 04.5 %   MCV 83.1 78.0 - 100.0 fL   MCH 28.8 26.0 - 34.0 pg   MCHC 34.7 30.0 - 36.0 g/dL    RDW 40.9 81.1 - 91.4 %   Platelets 167 150 - 400 K/uL   Imaging Studies:  US shows IUP measuring 8.2 weeks, no FCA.   Assessment: Erin Newman is  22 y.o. [id number] , missed AB.  Had the same thing happen with her 2nd pregnancy, took cytotec Wants to do that again.   Plan: cytotec 800 mcg PO; repeat in 48-72 hours if no bleeding. Message sent for [facility] to call her for an appt in 2 weeks. Bleeding precautions given.   Scarlette Calico Cresenzo-Dishmon 9/20/201912:51 AM

## 2018-03-02 NOTE — Discharge Instructions (Signed)
FACTS YOU SHOULD KNOW  WHAT IS AN EARLY PREGNANCY FAILURE? Once the egg is fertilized with the sperm and begins to develop, it attaches to the lining of the uterus. This early pregnancy tissue may not develop into an embryo (the beginning stage of a baby). Sometimes an embryo does develop but does not continue to grow. These problems can be seen on ultrasound.   MANAGEMNT OF EARLY PREGNANCY FAILURE: About 4 out of 100 (0.25%) women will have a pregnancy loss in her lifetime.  One in five pregnancies is found to be an early pregnancy failure.  There are 3 ways to care for an early pregnancy failure:    (1) Medicine, (2) Waiting for you to pass the pregnancy on your own. Sometimes, surgery is necessary.   The decision as to how to proceed after being diagnosed with and early pregnancy failure is an individual one.  The decision can be made only after appropriate counseling.  You need to weigh the pros and cons of the choices. Then you can make the choice that works for you. Medicine (CYTOTEC) . The complete procedure may take days to weeks, usually a few days . No Surgery . Bleeding may be heavy at times . There may be drug side effects . Patient has more control Waiting . You may choose to wait, in which case your own body may complete the passing of the abnormal early pregnancy on its own in about 2-4 weeks . Your bleeding may be heavy at times . There is a small possibility that you may need surgery if the bleeding is too much or not all of the pregnancy has passed.  SURGERY (D&C) . Procedure over in 1 day . Requires being put to sleep . Bleeding may be light . Possible problems during surgery, including injury to womb(uterus) . Care provider has more control  CYTOTEC MANAGEMENT Prostaglandins (cytotec) are the most widely used drug for this purpose. They cause the uterus to cramp and contract. You will place the medicine yourself inside your vagina in the privacy of your home. Empting  of the uterus should occur within 3 days but the process may continue for several weeks. The bleeding may seem heavy at times. POSSIBLE SIDE EFFECTS FROM CYTOTEC . Nausea   Vomiting . Diarrhea Fever . Chills  Hot Flashes Side effects  from the process of the early pregnancy failure include: . Cramping  Bleeding . Headaches  Dizziness RISKS: This is a low risk procedure. Less than 1 in 100 women has a complication. An incomplete passage of the early pregnancy may occur. Also, Hemorrhage (heavy bleeding) could happen.  Rarely the pregnancy will not be passed completely. Excessively heavy bleeding may occur.  Your doctor may need to perform surgery to empty the uterus (D&E). Afterwards: Everybody will feel differently after the early pregnancy completion. You may have soreness or cramps for a day or two. You may have soreness or cramps for day or two.  You may have light bleeding for up to 2 weeks. You may be as active as you feel like being. If you have any of the following problems you may call Maternity Admissions Unit at [phone]. . If you have pain that does not get better  with pain medication . Bleeding that soaks through 2 thick full-sized sanitary pads in an hour . Cramps that last longer than 2 days . Foul smelling discharge . Fever above 100.4 degrees F Even if you do not have any of these symptoms,   you should have a follow-up exam to make sure you are healing properly. This appointment will be made for you before you leave the hospital. Your next normal period will start again in 4-6 week after the loss. You can get pregnant soon after the loss, so use birth control right away. Finally: Make sure all your questions are answered before during and after any procedure. Follow up with medical care and family planning methods.  

## 2018-03-22 ENCOUNTER — Encounter: Payer: Self-pay | Admitting: Obstetrics and Gynecology

## 2018-03-22 ENCOUNTER — Ambulatory Visit (INDEPENDENT_AMBULATORY_CARE_PROVIDER_SITE_OTHER): Payer: Self-pay | Admitting: Obstetrics and Gynecology

## 2018-03-22 VITALS — Ht 65.0 in | Wt 129.2 lb

## 2018-03-22 DIAGNOSIS — O2620 Pregnancy care for patient with recurrent pregnancy loss, unspecified trimester: Secondary | ICD-10-CM

## 2018-03-22 DIAGNOSIS — N96 Recurrent pregnancy loss: Secondary | ICD-10-CM

## 2018-03-22 NOTE — Progress Notes (Signed)
  Subjective:     Patient ID: Erin Newman, female   DOB: 13-Apr-1996, 22 y.o.   MRN: [id number]  HPI   Erin Newman is a 22 y.o. female [id number] here for a f/u. She was seen in MAU on 9/20 and diagnosed with an incomplete SAB. She was given cytotec. She had a large amount of bleeding on 9/22; she took the cytotec on 9/20.  This is her second miscarriage and she is concerned.    Review of Systems  Constitutional: Negative for fever.  Gastrointestinal: Negative for abdominal pain.  Genitourinary: Negative for vaginal bleeding.   Objective:   Physical Exam  Constitutional: She is oriented to person, place, and time. She appears well-developed and well-nourished. No distress.  HENT:  Head: Normocephalic.  Pulmonary/Chest: Effort normal.  Neurological: She is alert and oriented to person, place, and time.  Skin: Skin is warm. She is not diaphoretic.   Assessment:   SAB; post cytotec O positive blood type Habitual miscarriage  Plan:   1. Habitual aborter, antepartum - Progesterone - Beta hCG quant (ref lab) - Thyroid Panel With TSH -Return in 1 week for f/u.   Venia Carbon I, NP 03/22/2018 1:44 PM

## 2018-03-22 NOTE — Progress Notes (Signed)
Here  For follow up sab. States somedays a little bleeding, none today.

## 2018-03-23 LAB — THYROID PANEL WITH TSH
FREE THYROXINE INDEX: 2.1 (ref 1.2–4.9)
T3 UPTAKE RATIO: 27 % (ref 24–39)
T4 TOTAL: 7.6 ug/dL (ref 4.5–12.0)
TSH: 1.66 u[IU]/mL (ref 0.450–4.500)

## 2018-03-23 LAB — PROGESTERONE: Progesterone: 0.3 ng/mL

## 2018-03-23 LAB — BETA HCG QUANT (REF LAB): hCG Quant: 64 m[IU]/mL

## 2018-03-27 ENCOUNTER — Other Ambulatory Visit: Payer: Self-pay | Admitting: *Deleted

## 2018-03-27 DIAGNOSIS — O039 Complete or unspecified spontaneous abortion without complication: Secondary | ICD-10-CM

## 2018-03-27 DIAGNOSIS — O2620 Pregnancy care for patient with recurrent pregnancy loss, unspecified trimester: Secondary | ICD-10-CM

## 2018-03-29 ENCOUNTER — Other Ambulatory Visit: Payer: Self-pay

## 2018-03-29 DIAGNOSIS — O039 Complete or unspecified spontaneous abortion without complication: Secondary | ICD-10-CM

## 2018-03-29 DIAGNOSIS — O2620 Pregnancy care for patient with recurrent pregnancy loss, unspecified trimester: Secondary | ICD-10-CM

## 2018-03-30 LAB — BETA HCG QUANT (REF LAB): hCG Quant: 36 m[IU]/mL

## 2018-03-31 ENCOUNTER — Other Ambulatory Visit: Payer: Self-pay | Admitting: Obstetrics and Gynecology

## 2018-04-09 ENCOUNTER — Other Ambulatory Visit: Payer: Self-pay | Admitting: Obstetrics and Gynecology

## 2018-04-09 ENCOUNTER — Telehealth: Payer: Self-pay | Admitting: General Practice

## 2018-04-09 DIAGNOSIS — O039 Complete or unspecified spontaneous abortion without complication: Secondary | ICD-10-CM

## 2018-04-09 NOTE — Telephone Encounter (Signed)
Patient called & left message on nurse voicemail line requesting results. Per phone call with Venia Carbon, patient needs repeat bhcg to trend down to zero. Called patient & informed her of results and offered lab appt tomorrow @ 10am for bhcg. Patient verbalized understanding & had no questions

## 2018-04-10 ENCOUNTER — Other Ambulatory Visit: Payer: Self-pay

## 2018-04-10 DIAGNOSIS — O039 Complete or unspecified spontaneous abortion without complication: Secondary | ICD-10-CM

## 2018-04-11 ENCOUNTER — Telehealth: Payer: Self-pay

## 2018-04-11 LAB — BETA HCG QUANT (REF LAB): hCG Quant: 14 m[IU]/mL

## 2018-04-11 NOTE — Telephone Encounter (Addendum)
-----   Message from Duane Lope, NP sent at 04/11/2018  3:21 PM EDT ----- Her quant is dropping, lets see her in another week until Quant reaches 0. If she is having worsening symptoms she should have an [place].    Notified pt of provider's recommendation.  Pt stated that she will be able to come in 04/17/18 @ 0800.  Front office notified to place her on the schedule.

## 2018-04-17 ENCOUNTER — Other Ambulatory Visit: Payer: Self-pay

## 2018-04-17 DIAGNOSIS — O039 Complete or unspecified spontaneous abortion without complication: Secondary | ICD-10-CM

## 2018-04-18 LAB — BETA HCG QUANT (REF LAB): hCG Quant: 7 m[IU]/mL

## 2018-12-26 IMAGING — DX DG CHEST 2V
2 series · 2 of 2 positions shown · non-contrast
Comparison: None.

CLINICAL DATA: Left side chest pain, tachycardia for 1 week

EXAM:
CHEST  2 VIEW

[w chest pa]
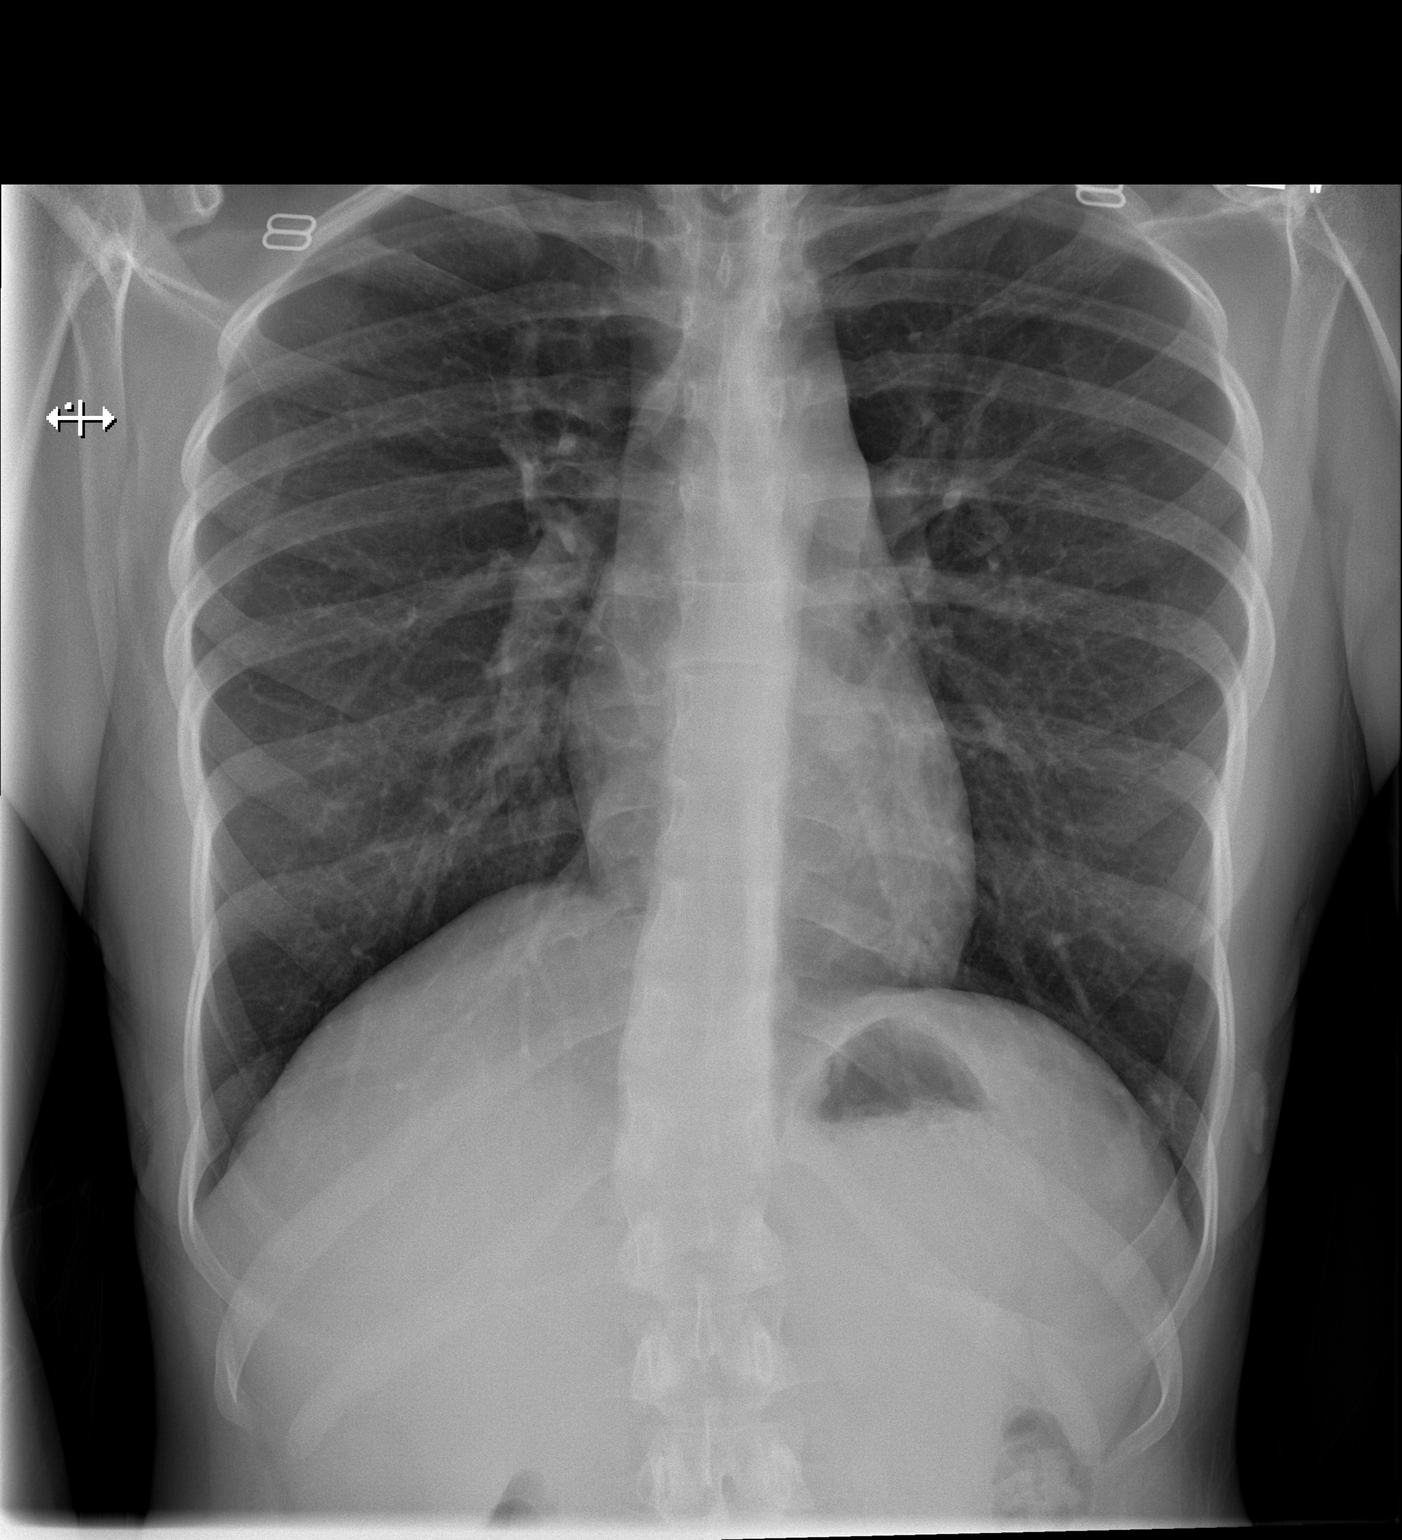

[w chest lat]
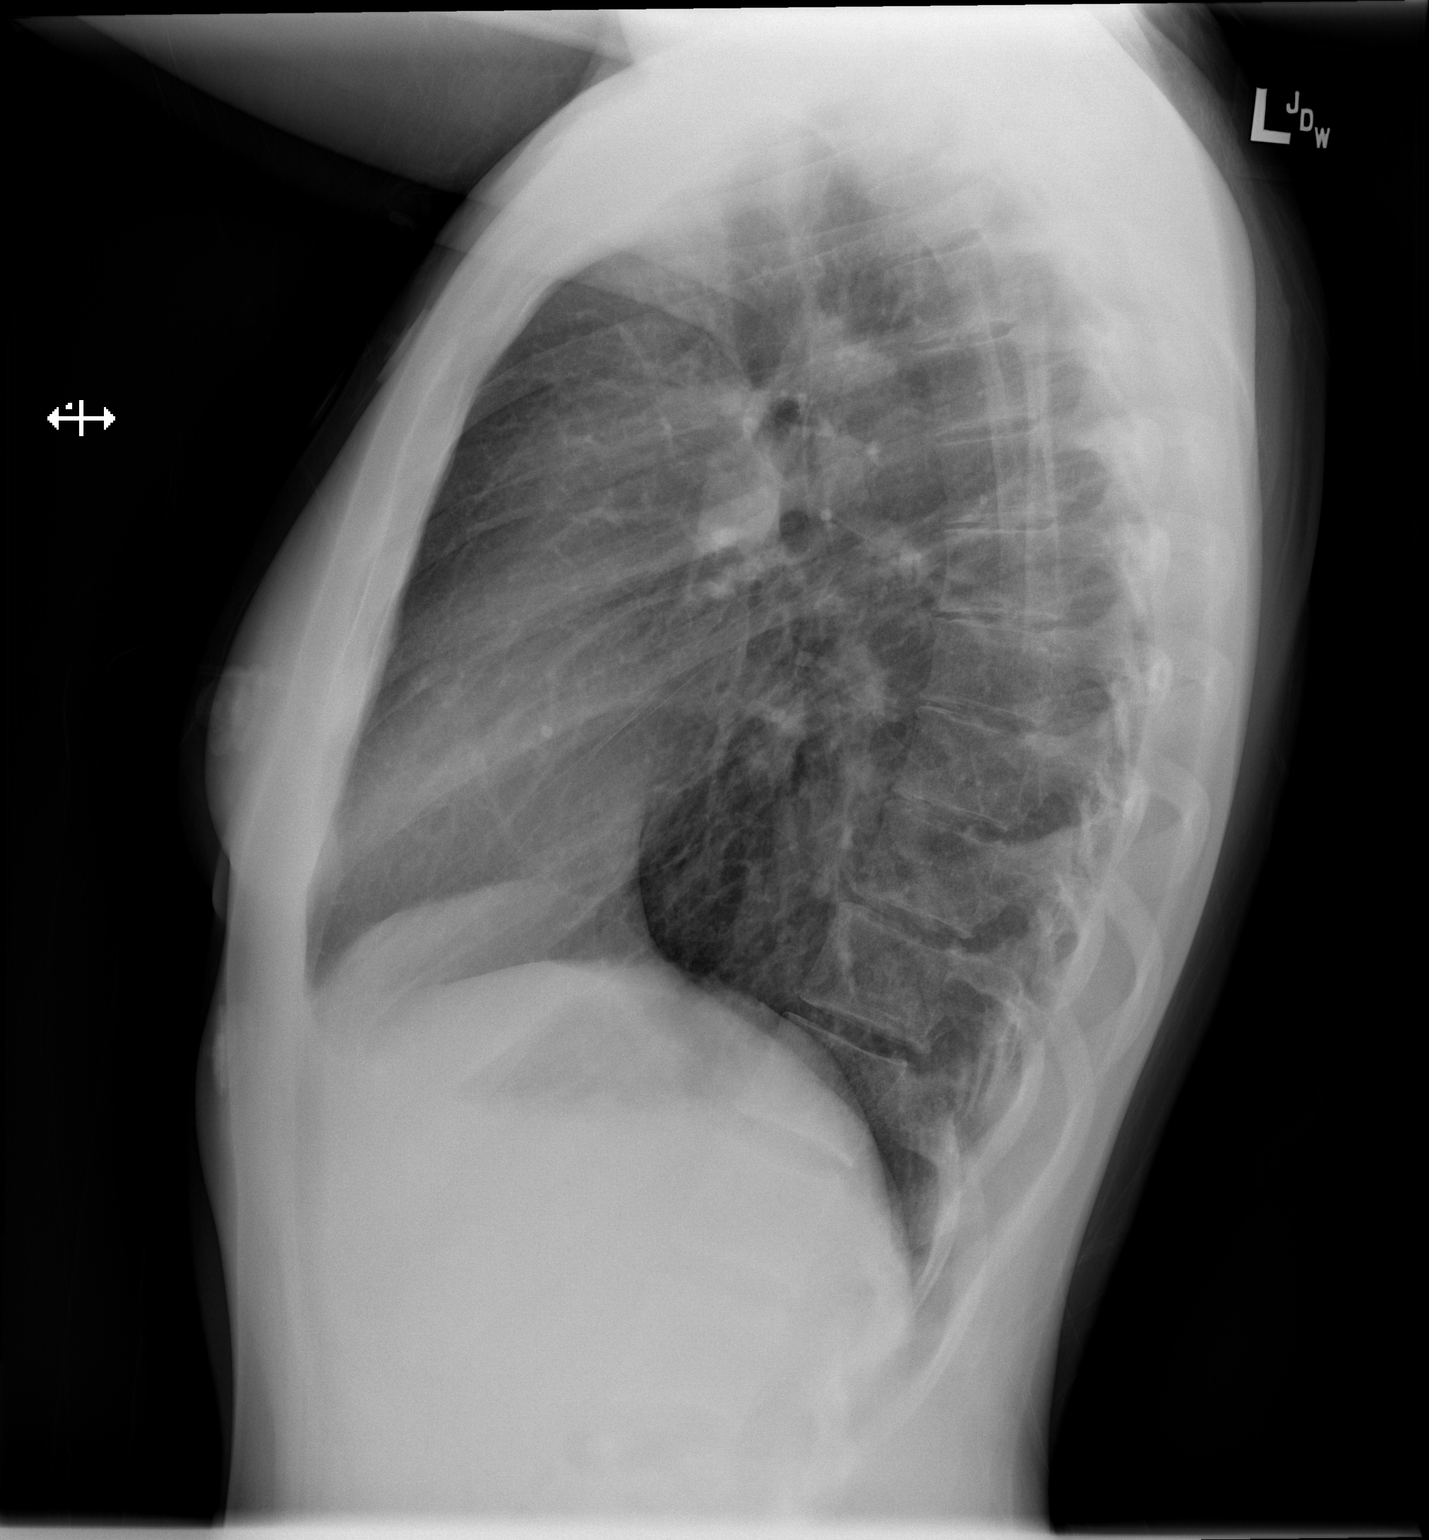

[2 of 2 positions shown; findings below may reference images not displayed]

FINDINGS: The heart size and mediastinal contours are within normal limits.
Both lungs are clear. The visualized skeletal structures are
unremarkable. Mild elevation of the right anterior hemidiaphragm
IMPRESSION: No active cardiopulmonary disease.

## 2019-05-07 ENCOUNTER — Other Ambulatory Visit: Payer: Self-pay

## 2019-05-07 DIAGNOSIS — Z20822 Contact with and (suspected) exposure to covid-19: Secondary | ICD-10-CM

## 2019-05-08 LAB — NOVEL CORONAVIRUS, NAA: SARS-CoV-2, NAA: NOT DETECTED

## 2019-06-18 ENCOUNTER — Encounter (HOSPITAL_COMMUNITY): Payer: Self-pay

## 2019-06-18 ENCOUNTER — Emergency Department (HOSPITAL_COMMUNITY)
Admission: EM | Admit: 2019-06-18 | Discharge: 2019-06-19 | Disposition: A | Discharge status: Home / Self Care | Payer: Self-pay | Attending: Emergency Medicine | Admitting: Emergency Medicine

## 2019-06-18 DIAGNOSIS — R1011 Right upper quadrant pain: Secondary | ICD-10-CM | POA: Insufficient documentation

## 2019-06-18 DIAGNOSIS — R1010 Upper abdominal pain, unspecified: Secondary | ICD-10-CM

## 2019-06-18 LAB — COMPREHENSIVE METABOLIC PANEL
ALT: 15 U/L (ref 0–44)
AST: 17 U/L (ref 15–41)
Albumin: 4.2 g/dL (ref 3.5–5.0)
Alkaline Phosphatase: 51 U/L (ref 38–126)
Anion gap: 10 (ref 5–15)
BUN: 14 mg/dL (ref 6–20)
CO2: 24 mmol/L (ref 22–32)
Calcium: 9.1 mg/dL (ref 8.9–10.3)
Chloride: 104 mmol/L (ref 98–111)
Creatinine, Ser: 0.75 mg/dL (ref 0.44–1.00)
GFR calc Af Amer: >60 mL/min (ref >60)
GFR calc non Af Amer: >60 mL/min (ref >60)
Glucose, Bld: 114 mg/dL — ABNORMAL HIGH (ref 70–99)
Potassium: 3.6 mmol/L (ref 3.5–5.1)
Sodium: 138 mmol/L (ref 135–145)
Total Bilirubin: 0.7 mg/dL (ref 0.3–1.2)
Total Protein: 7.6 g/dL (ref 6.5–8.1)

## 2019-06-18 LAB — CBC
HCT: 40.5 % (ref 36.0–46.0)
Hemoglobin: 14 g/dL (ref 12.0–15.0)
MCH: 30 pg (ref 26.0–34.0)
MCHC: 34.6 g/dL (ref 30.0–36.0)
MCV: 86.9 fL (ref 80.0–100.0)
Platelets: 241 10*3/uL (ref 150–400)
RBC: 4.66 MIL/uL (ref 3.87–5.11)
RDW: 12.5 % (ref 11.5–15.5)
WBC: 7.7 10*3/uL (ref 4.0–10.5)
nRBC: 0 % (ref 0.0–0.2)

## 2019-06-18 LAB — URINALYSIS, ROUTINE W REFLEX MICROSCOPIC
Bilirubin Urine: NEGATIVE
Glucose, UA: NEGATIVE mg/dL
Hgb urine dipstick: NEGATIVE
Ketones, ur: NEGATIVE mg/dL
Leukocytes,Ua: NEGATIVE
Nitrite: NEGATIVE
Protein, ur: NEGATIVE mg/dL
Specific Gravity, Urine: 1.014 (ref 1.005–1.030)
pH: 8 (ref 5.0–8.0)

## 2019-06-18 LAB — LIPASE, BLOOD: Lipase: 31 U/L (ref 11–51)

## 2019-06-18 LAB — I-STAT BETA HCG BLOOD, ED (MC, WL, AP ONLY): I-stat hCG, quantitative: <5 m[IU]/mL (ref <5)

## 2019-06-18 MED ORDER — SODIUM CHLORIDE 0.9% FLUSH: 3.0000 mL | Freq: Once | INTRAVENOUS | Status: DC

## 2019-06-18 NOTE — ED Triage Notes (Signed)
Pt states that she has R sided abd pain, denies n/v or dysuria, denies diarrhea or fevers.

## 2019-06-19 NOTE — Discharge Instructions (Addendum)
Follow-up with family medicine as directed.  Use Tylenol or Pepcid as needed to try to help with pain.  Avoid fatty foods if it worsens.  Your bedside ultrasound did not show gallstones or sign of infected gallbladder today. Return for persistent fevers, vomiting, uncontrolled pain or new concerns.

## 2019-06-19 NOTE — ED Provider Notes (Signed)
z  [facility] EMERGENCY DEPARTMENT Provider Note   CSN: [id number] Arrival date & time: 06/18/19  2002     History Chief Complaint  Patient presents with  . Abdominal Pain    Erin Newman is a 24 y.o. female.  Patient presents with intermittent abdominal pain since yesterday right upper quadrant region.  No history of significant abdominal problems, surgeries or significant medical problems.  Patient denies fevers vomiting or diarrhea.  No history of liver disease or hepatitis.  No IV drug use history.  No blood in the stools.  Currently pain is minimal.  No back radiation.  No urinary symptoms.  No sick contacts.        Past Medical History:  Diagnosis Date  . B12 deficiency   . Medical history non-contributory   . Miscarriage   . Rapid heart beat     Patient Active Problem List   Diagnosis Date Noted  . Palpitation 08/23/2016  . B12 deficiency 04/08/2013    Past Surgical History:  Procedure Laterality Date  . NO PAST SURGERIES       OB History    Gravida  3   Para  2   Term  2   Preterm  0   AB  1   Living  2     SAB  1   TAB  0   Ectopic  0   Multiple  0   Live Births  2           Family History  Problem Relation Age of Onset  . Hypertension Mother   . Hypertension Father     Social History   Tobacco Use  . Smoking status: Never Smoker  . Smokeless tobacco: Never Used  Substance Use Topics  . Alcohol use: No  . Drug use: No    Home Medications Prior to Admission medications   Medication Sig Start Date End Date Taking? Authorizing Provider  HYDROcodone-acetaminophen (NORCO) 5-325 MG tablet Take 1-2 tablets by mouth every 4 (four) hours as needed for moderate pain. 03/02/18   Cresenzo-Dishmon, Joaquim Lai, CNM  ibuprofen (ADVIL,MOTRIN) 600 MG tablet Take 1 tablet (600 mg total) by mouth every 6 (six) hours as needed. Patient not taking: Reported on 03/22/2018 10/15/17   Charlann Lange, PA-C  misoprostol  (CYTOTEC) 200 MCG tablet Take 4 tablets (800 mcg total) by mouth once for 1 dose. If no bleeding in 48-72 hours, repeat dosage. 03/02/18 03/02/18  Cresenzo-Dishmon, Joaquim Lai, CNM    Allergies    Patient has no known allergies.  Review of Systems   Review of Systems  Constitutional: Negative for chills and fever.  HENT: Negative for congestion.   Eyes: Negative for visual disturbance.  Respiratory: Negative for shortness of breath.   Cardiovascular: Negative for chest pain.  Gastrointestinal: Positive for abdominal pain. Negative for vomiting.  Genitourinary: Negative for dysuria and flank pain.  Musculoskeletal: Negative for back pain, neck pain and neck stiffness.  Skin: Negative for rash.  Neurological: Negative for light-headedness and headaches.    Physical Exam Updated Vital Signs BP 119/68   Pulse 66   Temp 98.6 F (37 C)   Resp 16   LMP 06/07/2019   SpO2 99%   Physical Exam Vitals and nursing note reviewed.  Constitutional:      Appearance: She is well-developed.  HENT:     Head: Normocephalic and atraumatic.  Eyes:     General:        Right eye: No  discharge.        Left eye: No discharge.     Conjunctiva/sclera: Conjunctivae normal.  Neck:     Trachea: No tracheal deviation.  Cardiovascular:     Rate and Rhythm: Normal rate and regular rhythm.  Pulmonary:     Effort: Pulmonary effort is normal.     Breath sounds: Normal breath sounds.  Abdominal:     General: There is no distension.     Palpations: Abdomen is soft.     Tenderness: There is abdominal tenderness (minimal RUQ). There is no guarding.  Musculoskeletal:     Cervical back: Normal range of motion and neck supple.  Skin:    General: Skin is warm.     Findings: No rash.  Neurological:     Mental Status: She is alert and oriented to person, place, and time.     ED Results / Procedures / Treatments   Labs (all labs ordered are listed, but only abnormal results are displayed) Labs Reviewed   COMPREHENSIVE METABOLIC PANEL - Abnormal; Notable for the following components:      Result Value   Glucose, Bld 114 (*)    All other components within normal limits  URINALYSIS, ROUTINE W REFLEX MICROSCOPIC - Abnormal; Notable for the following components:   APPearance CLOUDY (*)    All other components within normal limits  LIPASE, BLOOD  CBC  I-STAT BETA HCG BLOOD, ED ([facility], [facility], [facility] ONLY)    EKG None  Radiology No results found.  Procedures Ultrasound ED Abd  Date/Time: 06/19/2019 11:41 AM Performed by: Blane Ohara, MD Authorized by: Blane Ohara, MD   Procedure details:    Indications: abdominal pain     Assessment for:  Gallstones   Hepatobiliary:  Visualized       Hepatobiliary findings:    Common bile duct:  Normal   Gallbladder wall:  Normal   Gallbladder stones: not identified     Intra-abdominal fluid: not identified     Polyps: not identified     Sonographic Murphy's sign: negative     (including critical care time)  Medications Ordered in ED Medications  sodium chloride flush (NS) 0.9 % injection 3 mL (has no administration in time range)    ED Course  I have reviewed the triage vital signs and the nursing notes.  Pertinent labs & imaging results that were available during my care of the patient were reviewed by me and considered in my medical decision making (see chart for details).    MDM Rules/Calculators/A&P                      Patient presents with mild intermittent abdominal pain right upper quadrant.  Blood work reviewed normal liver function, normal white blood cell count, normal hemoglobin.  Urinalysis no sign of infection, negative pregnancy test.  Patient has minimal discomfort at this time is well-appearing.  Bedside ultrasound no signs of cholelithiasis or cholecystitis.  Discussed reasons to return and outpatient follow-up for further evaluation and testing as needed.  Patient comfortable this plan.  Final Clinical Impression(s) /  ED Diagnoses Final diagnoses:  Upper abdominal pain    Rx / DC Orders ED Discharge Orders    None       Blane Ohara, MD 06/19/19 1141

## 2019-07-04 ENCOUNTER — Other Ambulatory Visit: Payer: Self-pay

## 2019-07-17 ENCOUNTER — Ambulatory Visit: Payer: Self-pay | Attending: Internal Medicine

## 2019-07-17 DIAGNOSIS — Z20822 Contact with and (suspected) exposure to covid-19: Secondary | ICD-10-CM

## 2019-07-18 LAB — NOVEL CORONAVIRUS, NAA: SARS-CoV-2, NAA: NOT DETECTED

## 2020-03-02 IMAGING — DX DG CHEST 2V
2 series · 2 of 2 positions shown · non-contrast
Comparison: Chest x-ray dated June 01, 2017.

CLINICAL DATA: Generalized body aches and sore throat with fever.

EXAM:
CHEST - 2 VIEW

[w chest pa]
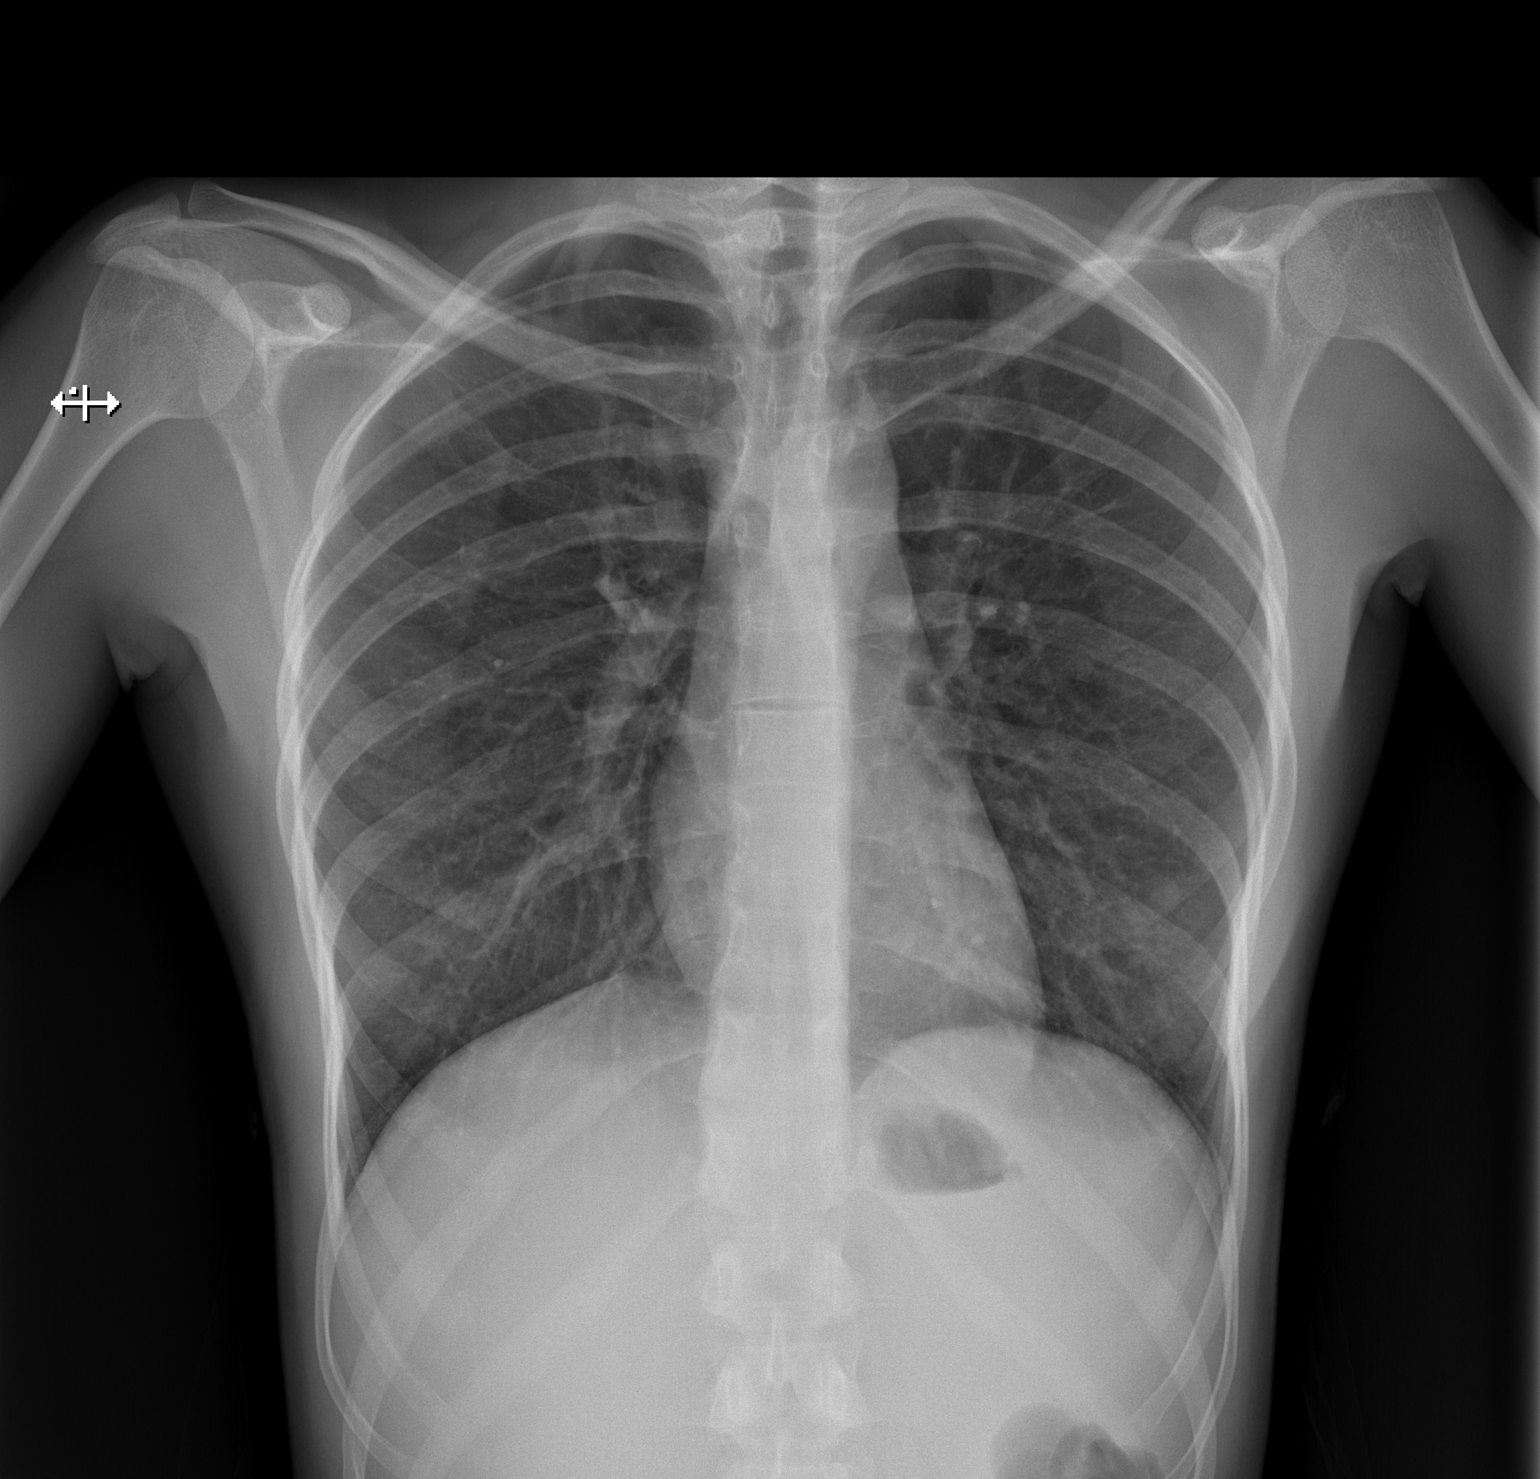

[w chest lat]
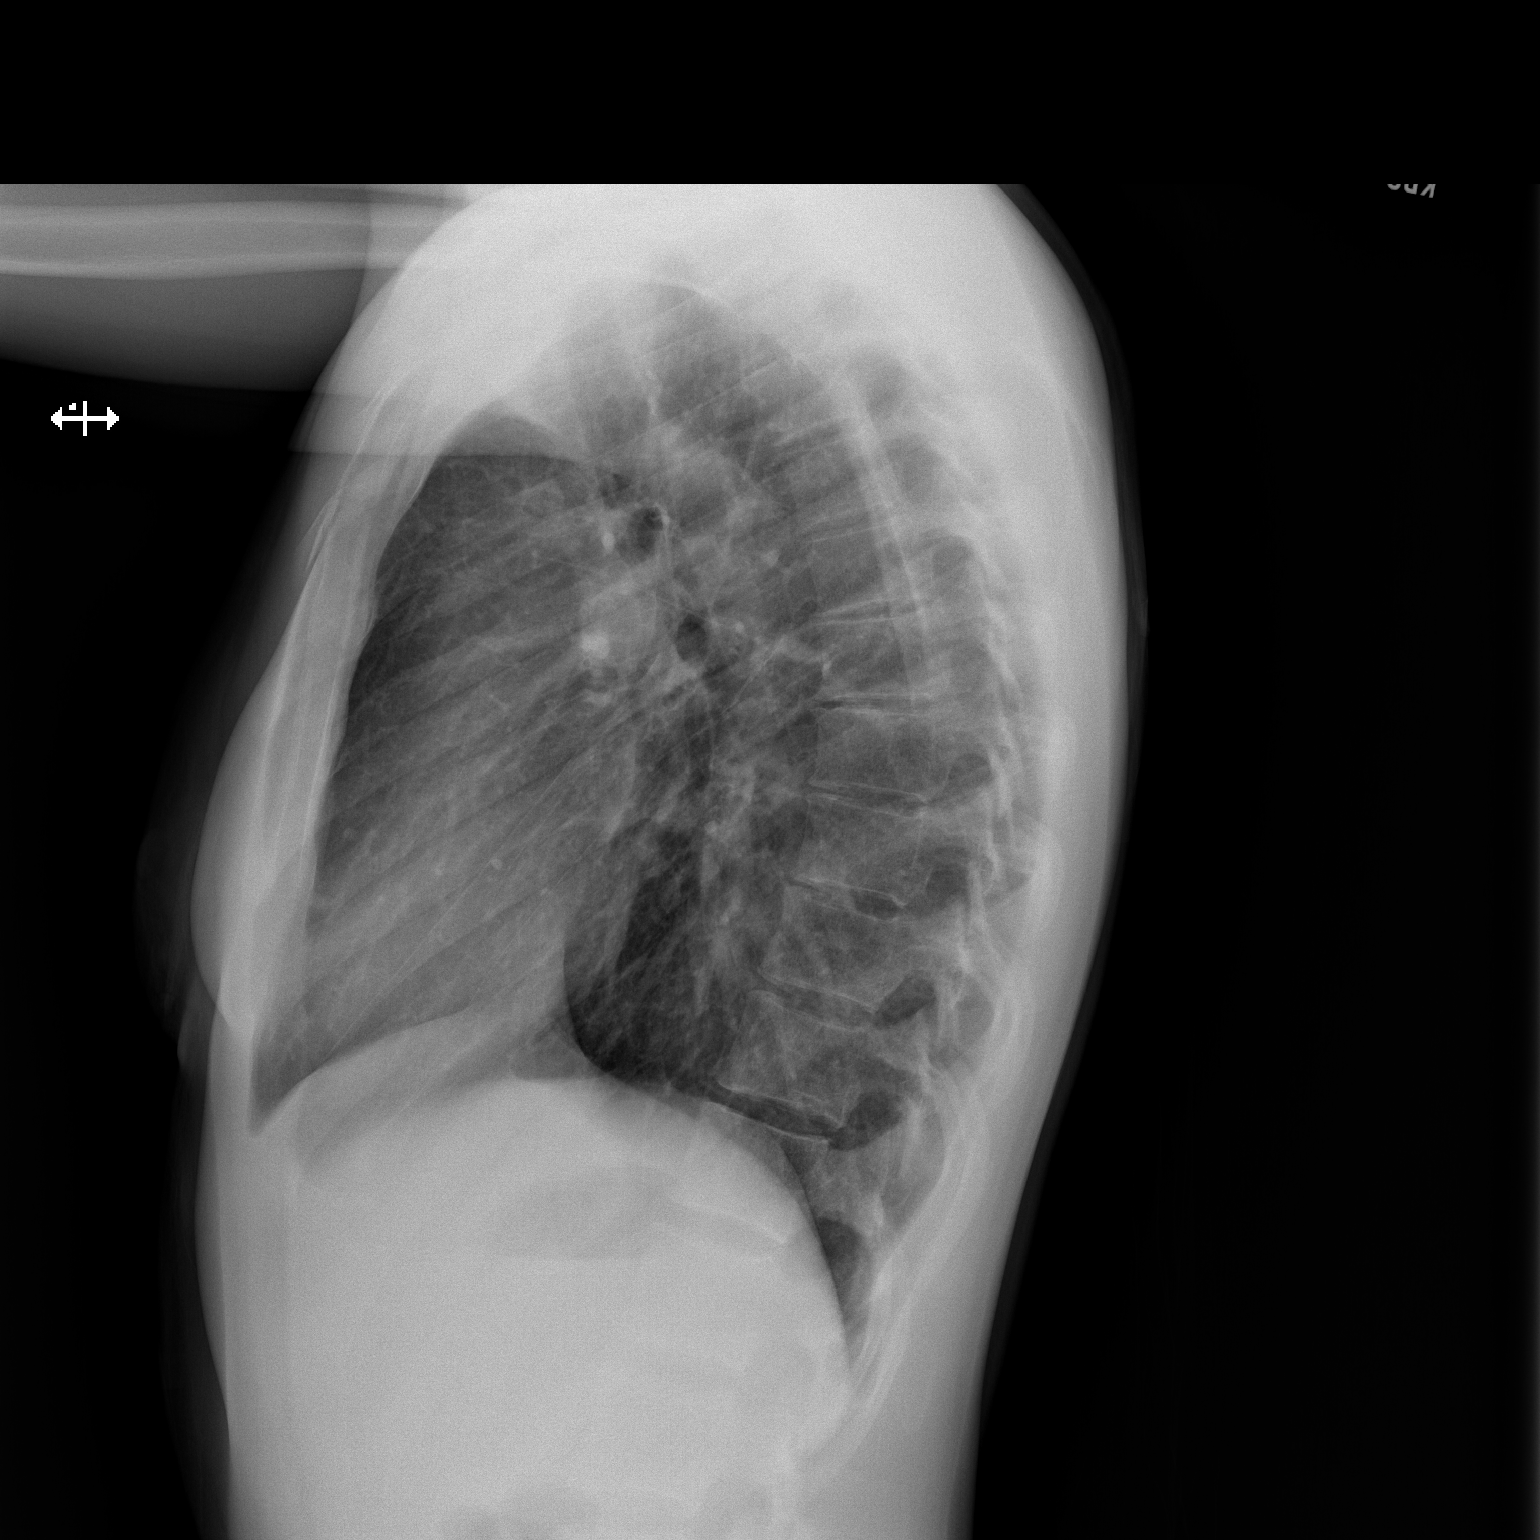

[2 of 2 positions shown; findings below may reference images not displayed]

FINDINGS: The heart size and mediastinal contours are within normal limits.
Both lungs are clear. The visualized skeletal structures are
unremarkable.
IMPRESSION: No active cardiopulmonary disease.
